# Patient Record
Sex: Female | Born: 1950 | Race: White | Hispanic: No | Marital: Married | Smoking: Former smoker
Health system: Southern US, Community
[De-identification: ages and names within clinical notes are randomized; demographics above are authoritative.]

## PROBLEM LIST (undated history)

## (undated) DIAGNOSIS — Z8669 Personal history of other diseases of the nervous system and sense organs: Secondary | ICD-10-CM

## (undated) DIAGNOSIS — E079 Disorder of thyroid, unspecified: Secondary | ICD-10-CM

## (undated) DIAGNOSIS — M5090 Cervical disc disorder, unspecified, unspecified cervical region: Secondary | ICD-10-CM

## (undated) DIAGNOSIS — M8580 Other specified disorders of bone density and structure, unspecified site: Secondary | ICD-10-CM

## (undated) DIAGNOSIS — N6019 Diffuse cystic mastopathy of unspecified breast: Secondary | ICD-10-CM

## (undated) DIAGNOSIS — M810 Age-related osteoporosis without current pathological fracture: Secondary | ICD-10-CM

## (undated) DIAGNOSIS — G4700 Insomnia, unspecified: Secondary | ICD-10-CM

## (undated) HISTORY — PX: OTHER SURGICAL HISTORY: SHX169

## (undated) HISTORY — PX: UPPER GASTROINTESTINAL ENDOSCOPY: SHX188

## (undated) HISTORY — DX: Age-related osteoporosis without current pathological fracture: M81.0

## (undated) HISTORY — DX: Insomnia, unspecified: G47.00

## (undated) HISTORY — DX: Disorder of thyroid, unspecified: E07.9

## (undated) HISTORY — DX: Diffuse cystic mastopathy of unspecified breast: N60.19

## (undated) HISTORY — DX: Personal history of other diseases of the nervous system and sense organs: Z86.69

## (undated) HISTORY — PX: COLONOSCOPY: SHX174

## (undated) HISTORY — PX: POLYPECTOMY: SHX149

## (undated) HISTORY — DX: Cervical disc disorder, unspecified, unspecified cervical region: M50.90

## (undated) HISTORY — DX: Other specified disorders of bone density and structure, unspecified site: M85.80

---

## 2000-08-11 ENCOUNTER — Other Ambulatory Visit: Admission: RE | Admit: 2000-08-11 | Discharge: 2000-08-11 | Payer: Self-pay | Admitting: Obstetrics and Gynecology

## 2001-06-29 ENCOUNTER — Other Ambulatory Visit: Admission: RE | Admit: 2001-06-29 | Discharge: 2001-06-29 | Payer: Self-pay | Admitting: Obstetrics and Gynecology

## 2002-09-27 ENCOUNTER — Encounter: Payer: Self-pay | Admitting: Internal Medicine

## 2002-09-27 ENCOUNTER — Encounter: Admission: RE | Admit: 2002-09-27 | Discharge: 2002-09-27 | Payer: Self-pay | Admitting: Internal Medicine

## 2002-11-04 ENCOUNTER — Other Ambulatory Visit: Admission: RE | Admit: 2002-11-04 | Discharge: 2002-11-04 | Payer: Self-pay | Admitting: Internal Medicine

## 2003-02-10 ENCOUNTER — Encounter: Payer: Self-pay | Admitting: Internal Medicine

## 2003-02-10 ENCOUNTER — Encounter: Admission: RE | Admit: 2003-02-10 | Discharge: 2003-02-10 | Payer: Self-pay | Admitting: Internal Medicine

## 2003-12-25 ENCOUNTER — Other Ambulatory Visit: Admission: RE | Admit: 2003-12-25 | Discharge: 2003-12-25 | Payer: Self-pay | Admitting: Internal Medicine

## 2003-12-29 ENCOUNTER — Encounter: Admission: RE | Admit: 2003-12-29 | Discharge: 2003-12-29 | Payer: Self-pay | Admitting: Internal Medicine

## 2005-04-15 ENCOUNTER — Other Ambulatory Visit: Admission: RE | Admit: 2005-04-15 | Discharge: 2005-04-15 | Payer: Self-pay | Admitting: Internal Medicine

## 2005-05-26 ENCOUNTER — Ambulatory Visit: Payer: Self-pay | Admitting: Internal Medicine

## 2005-06-01 ENCOUNTER — Ambulatory Visit: Payer: Self-pay | Admitting: Internal Medicine

## 2005-06-01 ENCOUNTER — Encounter (INDEPENDENT_AMBULATORY_CARE_PROVIDER_SITE_OTHER): Payer: Self-pay | Admitting: *Deleted

## 2006-05-04 ENCOUNTER — Other Ambulatory Visit: Admission: RE | Admit: 2006-05-04 | Discharge: 2006-05-04 | Payer: Self-pay | Admitting: Internal Medicine

## 2007-06-05 ENCOUNTER — Other Ambulatory Visit: Admission: RE | Admit: 2007-06-05 | Discharge: 2007-06-05 | Payer: Self-pay | Admitting: Internal Medicine

## 2007-09-13 ENCOUNTER — Encounter: Admission: RE | Admit: 2007-09-13 | Discharge: 2007-09-13 | Payer: Self-pay | Admitting: Internal Medicine

## 2008-09-08 ENCOUNTER — Ambulatory Visit: Payer: Self-pay | Admitting: Internal Medicine

## 2008-09-08 ENCOUNTER — Other Ambulatory Visit: Admission: RE | Admit: 2008-09-08 | Discharge: 2008-09-08 | Payer: Self-pay | Admitting: Internal Medicine

## 2009-03-30 ENCOUNTER — Ambulatory Visit: Payer: Self-pay | Admitting: Internal Medicine

## 2009-09-09 ENCOUNTER — Encounter: Admission: RE | Admit: 2009-09-09 | Discharge: 2009-09-09 | Payer: Self-pay | Admitting: Internal Medicine

## 2009-09-29 ENCOUNTER — Ambulatory Visit: Payer: Self-pay | Admitting: Internal Medicine

## 2009-09-29 ENCOUNTER — Other Ambulatory Visit: Admission: RE | Admit: 2009-09-29 | Discharge: 2009-09-29 | Payer: Self-pay | Admitting: Internal Medicine

## 2009-11-24 ENCOUNTER — Encounter (INDEPENDENT_AMBULATORY_CARE_PROVIDER_SITE_OTHER): Payer: Self-pay | Admitting: *Deleted

## 2010-01-26 ENCOUNTER — Encounter (INDEPENDENT_AMBULATORY_CARE_PROVIDER_SITE_OTHER): Payer: Self-pay | Admitting: *Deleted

## 2010-01-27 ENCOUNTER — Ambulatory Visit: Payer: Self-pay | Admitting: Internal Medicine

## 2010-02-10 ENCOUNTER — Ambulatory Visit: Payer: Self-pay | Admitting: Internal Medicine

## 2010-02-11 ENCOUNTER — Encounter: Payer: Self-pay | Admitting: Internal Medicine

## 2010-04-01 ENCOUNTER — Ambulatory Visit: Payer: Self-pay | Admitting: Internal Medicine

## 2010-10-07 ENCOUNTER — Ambulatory Visit: Payer: Self-pay | Admitting: Internal Medicine

## 2010-10-07 ENCOUNTER — Other Ambulatory Visit: Admission: RE | Admit: 2010-10-07 | Discharge: 2010-10-07 | Payer: Self-pay | Admitting: Internal Medicine

## 2010-10-19 ENCOUNTER — Encounter: Admission: RE | Admit: 2010-10-19 | Discharge: 2010-10-19 | Payer: Self-pay | Admitting: Internal Medicine

## 2010-11-04 ENCOUNTER — Ambulatory Visit: Payer: Self-pay | Admitting: Internal Medicine

## 2010-12-21 NOTE — Letter (Signed)
Summary: Colonoscopy Letter  [facility]  [address] [place], [place] [address]   Phone: [phone]  Fax: [phone]      November 24, 2009 MRN: [id number]   Maria Perkins [address] [place], [place]  [address]   Dear Ms. Gatz,   According to your medical record, it is time for you to schedule a Colonoscopy. The American Cancer Society recommends this procedure as a method to detect early colon cancer. Patients with a family history of colon cancer, or a personal history of colon polyps or inflammatory bowel disease are at increased risk.  This letter has beeen generated based on the recommendations made at the time of your procedure. If you feel that in your particular situation this may no longer apply, please contact our office.  Please call our office at [phone] to schedule this appointment or to update your records at your earliest convenience.  Thank you for cooperating with [place] to provide you with the very best care possible.   Sincerely,  Hedwig Morton. Juanda Chance, M.D.  [facility] Gastroenterology Division [phone]

## 2010-12-21 NOTE — Procedures (Signed)
Summary: Colonoscopy  Patient: [name] Note: All result statuses are Final unless otherwise noted.  Tests: (1) Colonoscopy (COL)   COL Colonoscopy           DONE     [facility]     [address]     [place], [place]  [address]           COLONOSCOPY PROCEDURE REPORT           PATIENT:  Maria Perkins, Maria Perkins  MR#:  [id number]     BIRTHDATE:  [birth year], 59 yrs. old  GENDER:  female           ENDOSCOPIST:  Hedwig Morton. Juanda Chance, MD     Referred by:  Sharlet Salina, M.D.           PROCEDURE DATE:  02/10/2010     PROCEDURE:  Colonoscopy 19[address]     ASA CLASS:  Class I     INDICATIONS:  Routine Risk Screening, family Hx of polyps last     colon2001     colon polyps in mothe and brother           MEDICATIONS:   Versed 8 mg, Fentanyl 75 mcg           DESCRIPTION OF PROCEDURE:   After the risks benefits and     alternatives of the procedure were thoroughly explained, informed     consent was obtained.  Digital rectal exam was performed and     revealed no rectal masses.   The LB [id number]-[id number] endoscope     was introduced through the anus and advanced to the cecum, which     was identified by both the appendix and ileocecal valve, without     limitations.  The quality of the prep was good, using MiraLax.     The instrument was then slowly withdrawn as the colon was fully     examined.     <<PROCEDUREIMAGES>>           FINDINGS:  A sessile polyp was found. 5 mm flat polyp right colon     Polyp was snared without cautery. Retrieval was successful (see     image4). snare polyp  This was otherwise a normal examination of     the colon (see image5, image3, image2, and image1).  Internal     hemorrhoids were found (see image5).   Retroflexed views in the     rectum revealed no abnormalities.    The scope was then withdrawn     from the patient and the procedure completed.           COMPLICATIONS:  None           ENDOSCOPIC IMPRESSION:     1) Sessile polyp     2) Otherwise  normal examination     3) Internal hemorrhoids     RECOMMENDATIONS:     1) Await pathology results           REPEAT EXAM:  In 5 - 7 year(s) for.           ______________________________     Hedwig Morton. Juanda Chance, MD           CC:           n.     eSIGNED:   Hedwig Morton. Delonda Coley at 02/10/2010 10:08 AM           Malon Kindle, [id number]  Note: An exclamation mark Marland Kitchen)  indicates a result that was not dispersed into the flowsheet. Document Creation Date: 02/10/2010 10:09 AM _______________________________________________________________________  (1) Order result status: Final Collection or observation date-time: 02/10/2010 10:00 Requested date-time:  Receipt date-time:  Reported date-time:  Referring Physician:   Ordering Physician: Lina Sar [phone]) Specimen Source:  Source: Launa Grill Order Number: [id number] Lab site:   Appended Document: Colonoscopy     Procedures Next Due Date:    Colonoscopy: 02/2015

## 2010-12-21 NOTE — Letter (Signed)
Summary: [facility] Instructions  [facility]  [address] [place], [place] [address]   Phone: [phone]  Fax: [phone]       DORI DEVINO    61-28-1952    MRN: [id number]       Procedure Day Dorna Bloom:  Wednesday  02/10/10     Arrival Time:  8:30am     Procedure Time:  9:30am     Location of Procedure:                    _ X_  [facility] (4th Floor) _   PREPARATION FOR COLONOSCOPY WITH MIRALAX  Starting 5 days prior to your procedure  Friday 03/18 do not eat nuts, seeds, popcorn, corn, beans, peas,  salads, or any raw vegetables.  Do not take any fiber supplements (e.g. Metamucil, Citrucel, and Benefiber). ____________________________________________________________________________________________________   THE DAY BEFORE YOUR PROCEDURE         DATE:  03/22  DAY:  Tuesday  1   Drink clear liquids the entire day-NO SOLID FOOD  2   Do not drink anything colored red or purple.  Avoid juices with pulp.  No orange juice.  3   Drink at least 64 oz. (8 glasses) of fluid/clear liquids during the day to prevent dehydration and help the prep work efficiently.  CLEAR LIQUIDS INCLUDE: Water Jello Ice Popsicles Tea (sugar ok, no milk/cream) Powdered fruit flavored drinks Coffee (sugar ok, no milk/cream) Gatorade Juice: apple, white grape, white cranberry  Lemonade Clear bullion, consomm, broth Carbonated beverages (any kind) Strained chicken noodle soup Hard Candy  4   Mix the entire bottle of Miralax with 64 oz. of Gatorade/Powerade in the morning and put in the refrigerator to chill.  5   At 3:00 pm take 2 Dulcolax/Bisacodyl tablets.  6   At 4:30 pm take one Reglan/Metoclopramide tablet.  7  Starting at 5:00 pm drink one 8 oz glass of the Miralax mixture every 15-20 minutes until you have finished drinking the entire 64 oz.  You should finish drinking prep around 7:30 or 8:00 pm.  8   If you are nauseated, you may take the 2nd  Reglan/Metoclopramide tablet at 6:30 pm.        9    At 8:00 pm take 2 more DULCOLAX/Bisacodyl tablets.     THE DAY OF YOUR PROCEDURE      DATE:   03/23  DAY: Wednesday  You may drink clear liquids until  7:30am   (2 HOURS BEFORE PROCEDURE).   MEDICATION INSTRUCTIONS  Unless otherwise instructed, you should take regular prescription medications with a small sip of water as early as possible the morning of your procedure.           OTHER INSTRUCTIONS  You will need a responsible adult at least 61 years of age to accompany you and drive you home.   This person must remain in the waiting room during your procedure.  Wear loose fitting clothing that is easily removed.  Leave jewelry and other valuables at home.  However, you may wish to bring a book to read or an iPod/MP3 player to listen to music as you wait for your procedure to start.  Remove all body piercing jewelry and leave at home.  Total time from sign-in until discharge is approximately 2-3 hours.  You should go home directly after your procedure and rest.  You can resume normal activities the day after your procedure.  The day  of your procedure you should not:   Drive   Make legal decisions   Operate machinery   Drink alcohol   Return to work  You will receive specific instructions about eating, activities and medications before you leave.   The above instructions have been reviewed and explained to me by   Ezra Sites RN  January 27, 2010 11:25 AM    I fully understand and can verbalize these instructions _____________________________ Date _______

## 2010-12-21 NOTE — Miscellaneous (Signed)
Summary: [facility]  Clinical Lists Changes  Medications: Added new medication of MIRALAX   POWD (POLYETHYLENE GLYCOL 3350) As per prep  instructions. - Signed Added new medication of REGLAN 10 MG  TABS (METOCLOPRAMIDE HCL) As per prep instructions. - Signed Added new medication of DULCOLAX 5 MG  TBEC (BISACODYL) Day before procedure take 2 at 3pm and 2 at 8pm. - Signed Rx of MIRALAX   POWD (POLYETHYLENE GLYCOL 3350) As per prep  instructions.;  #255gm x 0;  Signed;  Entered by: Ezra Sites RN;  Authorized by: Hart Carwin MD;  Method used: Electronically to [facility]*, [address] [place], [place]  [id number], Ph: [phone], Fax: [phone] Rx of REGLAN 10 MG  TABS (METOCLOPRAMIDE HCL) As per prep instructions.;  #2 x 0;  Signed;  Entered by: Ezra Sites RN;  Authorized by: Hart Carwin MD;  Method used: Electronically to [facility]*, [address] [place], [place]  [address], Ph: [phone], Fax: [phone] Rx of DULCOLAX 5 MG  TBEC (BISACODYL) Day before procedure take 2 at 3pm and 2 at 8pm.;  #4 x 0;  Signed;  Entered by: Ezra Sites RN;  Authorized by: Hart Carwin MD;  Method used: Electronically to [facility]*, [address] [place], [place]  [address], Ph: [phone], Fax: [phone] Observations: Added new observation of NKA: T (01/27/2010 10:59)    Prescriptions: DULCOLAX 5 MG  TBEC (BISACODYL) Day before procedure take 2 at 3pm and 2 at 8pm.  #4 x 0   Entered by:   Ezra Sites RN   Authorized by:   Hart Carwin MD   Signed by:   Ezra Sites RN on 01/27/2010   Method used:   Electronically to        [facility]* (retail)       [address]       [place], [place]  [address]       Ph: [phone]       Fax: [phone]   RxID:   [id number] REGLAN 10 MG  TABS (METOCLOPRAMIDE HCL) As per prep instructions.  #2 x 0   Entered by:   Ezra Sites RN   Authorized by:   Hart Carwin MD   Signed by:    Ezra Sites RN on 01/27/2010   Method used:   Electronically to        [facility]* (retail)       [address]       [place], [place]  [address]       Ph: [phone]       Fax: [phone]   RxID:   [id number] MIRALAX   POWD (POLYETHYLENE GLYCOL 3350) As per prep  instructions.  #255gm x 0   Entered by:   Ezra Sites RN   Authorized by:   Hart Carwin MD   Signed by:   Ezra Sites RN on 01/27/2010   Method used:   Electronically to        [facility]* (retail)       [address]       [place], [place]  [address]       Ph: [phone]       Fax: [phone]   RxID:   [id number]

## 2010-12-21 NOTE — Letter (Signed)
Summary: Patient Notice- Polyp Results  [facility]  [address] [place], [place] [address]   Phone: [phone]  Fax: [phone]        February 11, 2010 MRN: [id number]    ALLESSANDRA BERNARDI [address] [place], [place]  [address]    Dear Ms. Magill,  I am pleased to inform you that the colon polyp(s) removed during your recent colonoscopy was (were) found to be benign (no cancer detected) upon pathologic examination.Your polyp was adenomatous ( precancerous)  I recommend you have a repeat colonoscopy examination in 5_ years to look for recurrent polyps, as having colon polyps increases your risk for having recurrent polyps or even colon cancer in the future.  Should you develop new or worsening symptoms of abdominal pain, bowel habit changes or bleeding from the rectum or bowels, please schedule an evaluation with either your primary care physician or with me.  Additional information/recommendations:  _x_ No further action with gastroenterology is needed at this time. Please      follow-up with your primary care physician for your other healthcare      needs.  __ Please call [phone] to schedule a return visit to review your      situation.  __ Please keep your follow-up visit as already scheduled.  __ Continue treatment plan as outlined the day of your exam.  Please call us if you are having persistent problems or have questions about your condition that have not been fully answered at this time.  Sincerely,  Hart Carwin MD  This letter has been electronically signed by your physician.  Appended Document: Patient Notice- Polyp Results Letter mailed 3.25.11

## 2011-01-11 DIAGNOSIS — G43909 Migraine, unspecified, not intractable, without status migrainosus: Secondary | ICD-10-CM

## 2011-01-11 DIAGNOSIS — E039 Hypothyroidism, unspecified: Secondary | ICD-10-CM

## 2011-01-13 DIAGNOSIS — G43909 Migraine, unspecified, not intractable, without status migrainosus: Secondary | ICD-10-CM

## 2011-04-07 ENCOUNTER — Ambulatory Visit (INDEPENDENT_AMBULATORY_CARE_PROVIDER_SITE_OTHER): Payer: BC Managed Care – PPO | Admitting: Internal Medicine

## 2011-04-07 ENCOUNTER — Encounter: Payer: Self-pay | Admitting: Internal Medicine

## 2011-04-07 DIAGNOSIS — E039 Hypothyroidism, unspecified: Secondary | ICD-10-CM

## 2011-04-07 DIAGNOSIS — G4700 Insomnia, unspecified: Secondary | ICD-10-CM

## 2011-04-07 MED ORDER — ZOLPIDEM TARTRATE 10 MG PO TABS: 10.0000 mg | ORAL_TABLET | Freq: Every evening | ORAL | Status: DC | PRN

## 2011-04-07 MED ORDER — LEVOTHYROXINE SODIUM 125 MCG PO TABS: 125.0000 ug | ORAL_TABLET | Freq: Every day | ORAL | Status: DC

## 2011-04-07 NOTE — Patient Instructions (Signed)
Call if headache persists

## 2011-04-07 NOTE — Progress Notes (Signed)
  Subjective:    Patient ID: Maria Perkins, female    DOB: [birth year], 60 y.o.   MRN: [id number]  HPI For 6 month follow up of hypothyroidism. Has slight left parietal headache. No focal deficits reported. Unrelieved with Excedrin migraine x 1.  Some stress with selling house and sick dog.    Review of Systems  Neurological: Positive for headaches. Negative for dizziness, facial asymmetry and light-headedness.       Objective:   Physical Exam  Constitutional: She is oriented to person, place, and time.  Neck: No thyromegaly present.  Neurological: She is alert and oriented to person, place, and time. She has normal reflexes. No cranial nerve deficit. She exhibits normal muscle tone. Coordination normal.       EOM's full PERLA. No nystagmus. Fundi are benign.          Assessment & Plan:  1- Hypothyroidism- stable continue same dose of thyroid replacement 2-Neuralgia left parietal area likely stress related

## 2011-04-07 NOTE — Progress Notes (Signed)
Addended by: Odette Horns on: 04/07/2011 11:59 AM   Modules accepted: Orders

## 2011-04-11 ENCOUNTER — Encounter: Payer: Self-pay | Admitting: Internal Medicine

## 2011-04-12 ENCOUNTER — Encounter: Payer: Self-pay | Admitting: Internal Medicine

## 2011-10-03 ENCOUNTER — Telehealth: Payer: Self-pay | Admitting: Internal Medicine

## 2011-10-03 DIAGNOSIS — E039 Hypothyroidism, unspecified: Secondary | ICD-10-CM

## 2011-10-03 MED ORDER — LEVOTHYROXINE SODIUM 125 MCG PO TABS: 125.0000 ug | ORAL_TABLET | Freq: Every day | ORAL | Status: DC

## 2011-10-03 NOTE — Telephone Encounter (Signed)
rx refilled fpr patient for 1 month only, as she is due for lab  Work.

## 2011-10-03 NOTE — Telephone Encounter (Signed)
Call in 30 day supply Synthroid 0.125mg  to [facility]

## 2011-10-04 ENCOUNTER — Other Ambulatory Visit: Payer: Self-pay | Admitting: Internal Medicine

## 2011-10-17 ENCOUNTER — Ambulatory Visit (INDEPENDENT_AMBULATORY_CARE_PROVIDER_SITE_OTHER): Payer: BC Managed Care – PPO | Admitting: Internal Medicine

## 2011-10-17 ENCOUNTER — Encounter: Payer: Self-pay | Admitting: Internal Medicine

## 2011-10-17 VITALS — BP 106/64 | HR 64 | Temp 97.8°F | Ht 64.0 in | Wt 134.5 lb

## 2011-10-17 DIAGNOSIS — Z23 Encounter for immunization: Secondary | ICD-10-CM

## 2011-10-17 DIAGNOSIS — E039 Hypothyroidism, unspecified: Secondary | ICD-10-CM | POA: Insufficient documentation

## 2011-10-17 DIAGNOSIS — Z0000 Encounter for general adult medical examination without abnormal findings: Secondary | ICD-10-CM

## 2011-10-17 LAB — CBC WITH DIFFERENTIAL/PLATELET
Eosinophils Relative: 3 % (ref 0–5)
Lymphocytes Relative: 43 % (ref 12–46)
MCH: 30.6 pg (ref 26.0–34.0)
Monocytes Absolute: 0.5 10*3/uL (ref 0.1–1.0)
Neutro Abs: 2.1 10*3/uL (ref 1.7–7.7)
Neutrophils Relative %: 44 % (ref 43–77)
RBC: 4.48 MIL/uL (ref 3.87–5.11)
RDW: 13 % (ref 11.5–15.5)
WBC: 4.9 10*3/uL (ref 4.0–10.5)

## 2011-10-17 LAB — COMPREHENSIVE METABOLIC PANEL
ALT: 14 U/L (ref 0–35)
CO2: 29 mEq/L (ref 19–32)
Calcium: 10.1 mg/dL (ref 8.4–10.5)
Creat: 0.73 mg/dL (ref 0.50–1.10)
Potassium: 4.4 mEq/L (ref 3.5–5.3)
Sodium: 140 mEq/L (ref 135–145)
Total Bilirubin: 1.5 mg/dL — ABNORMAL HIGH (ref 0.3–1.2)

## 2011-10-17 LAB — POCT URINALYSIS DIPSTICK
Bilirubin, UA: NEGATIVE
Leukocytes, UA: NEGATIVE
Spec Grav, UA: 1.01
pH, UA: 6.5

## 2011-10-17 LAB — LIPID PANEL
HDL: 73 mg/dL (ref >39)
LDL Cholesterol: 90 mg/dL (ref 0–99)

## 2011-10-17 NOTE — Progress Notes (Signed)
  Subjective:    Patient ID: Maria Perkins, female    DOB: 1951/05/24, 60 y.o.   MRN: [id number]  HPI  60 year old white female with history of hypothyroidism in today for health maintenance exam. 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 Never required surgery. She has a history of osteopenia and took Fosamax 70 mg weekly but this was discontinued in 2006 after being on it for a number of years. Had colonoscopy in 2001. Was get influenza immunization today. Had tetanus immunization in 2005.  Family history: 2 brothers in good health no sisters. Mother died at age 7 from complications of a stroke that she had in 1999-11-09. Father died with history of diabetes and dementia.  Social history - is married, has 2 adult daughters one living in [place] in the a one living in [place].    Review of Systems  Constitutional: Negative.   HENT: Negative.   Eyes: Negative.   Cardiovascular: Negative.   Gastrointestinal: Negative.   Genitourinary: Negative.   Musculoskeletal: Negative.   Neurological: Negative.   Psychiatric/Behavioral: Negative.        Objective:   Physical Exam  Vitals reviewed. Constitutional: She is oriented to person, place, and time. She appears well-developed and well-nourished.  HENT:  Head: Normocephalic and atraumatic.  Right Ear: External ear normal.  Left Ear: External ear normal.  Mouth/Throat: Oropharynx is clear and moist.  Eyes: Conjunctivae and EOM are normal. Pupils are equal, round, and reactive to light. No scleral icterus.  Neck: Neck supple. No JVD present. No thyromegaly present.  Cardiovascular: Normal rate, regular rhythm, normal heart sounds and intact distal pulses.   No murmur heard. Pulmonary/Chest: Effort normal and breath sounds normal. She has no wheezes. She has no rales.  Abdominal: Soft. Bowel sounds are normal. She exhibits no distension. There  is no rebound and no guarding.  Genitourinary:       Deferred  Musculoskeletal: Normal range of motion. She exhibits no edema.  Lymphadenopathy:    She has no cervical adenopathy.  Neurological: She is alert and oriented to person, place, and time. She has normal reflexes. No cranial nerve deficit. Coordination normal.  Skin: Skin is warm and dry. She is not diaphoretic. No erythema. No pallor.  Psychiatric: She has a normal mood and affect. Judgment and thought content normal.          Assessment & Plan:  Hypothyroidism on thyroid replacement therapy  Plan: Return in 6 months for TSH and office visit. Influenza immunization given today.

## 2011-10-17 NOTE — Patient Instructions (Signed)
Continue with Synthroid. Fasting labs are drawn and are pending. They will be forwarded to you by mail. Continue same dose of Synthroid assuming TSH is normal and recheck in 6 months

## 2011-10-18 LAB — VITAMIN D 25 HYDROXY (VIT D DEFICIENCY, FRACTURES): Vit D, 25-Hydroxy: 51 ng/mL (ref 30–89)

## 2011-11-01 ENCOUNTER — Other Ambulatory Visit: Payer: Self-pay | Admitting: Internal Medicine

## 2011-11-01 DIAGNOSIS — Z1231 Encounter for screening mammogram for malignant neoplasm of breast: Secondary | ICD-10-CM

## 2011-11-24 ENCOUNTER — Ambulatory Visit: Payer: BC Managed Care – PPO

## 2011-11-30 ENCOUNTER — Other Ambulatory Visit: Payer: Self-pay

## 2011-11-30 MED ORDER — LEVOTHYROXINE SODIUM 125 MCG PO TABS: 125.0000 ug | ORAL_TABLET | Freq: Every day | ORAL | Status: DC

## 2011-12-06 ENCOUNTER — Ambulatory Visit
Admission: RE | Admit: 2011-12-06 | Discharge: 2011-12-06 | Disposition: A | Discharge status: Home / Self Care | Payer: BC Managed Care – PPO | Source: Ambulatory Visit | Attending: Internal Medicine | Admitting: Internal Medicine

## 2011-12-06 DIAGNOSIS — Z1231 Encounter for screening mammogram for malignant neoplasm of breast: Secondary | ICD-10-CM

## 2012-04-19 ENCOUNTER — Encounter: Payer: Self-pay | Admitting: Internal Medicine

## 2012-04-19 ENCOUNTER — Ambulatory Visit (INDEPENDENT_AMBULATORY_CARE_PROVIDER_SITE_OTHER): Payer: BC Managed Care – PPO | Admitting: Internal Medicine

## 2012-04-19 VITALS — BP 96/66 | HR 64 | Temp 98.5°F | Wt 129.5 lb

## 2012-04-19 DIAGNOSIS — E039 Hypothyroidism, unspecified: Secondary | ICD-10-CM

## 2012-04-19 DIAGNOSIS — N9489 Other specified conditions associated with female genital organs and menstrual cycle: Secondary | ICD-10-CM

## 2012-04-19 DIAGNOSIS — N898 Other specified noninflammatory disorders of vagina: Secondary | ICD-10-CM | POA: Insufficient documentation

## 2012-04-19 DIAGNOSIS — G4700 Insomnia, unspecified: Secondary | ICD-10-CM

## 2012-04-19 LAB — TSH: TSH: 0.532 u[IU]/mL (ref 0.350–4.500)

## 2012-04-19 NOTE — Progress Notes (Signed)
  Subjective:    Patient ID: Maria Perkins, female    DOB: 1951/09/28, 61 y.o.   MRN: [id number]  HPI 61 year old white female in today for six-month recheck. Long-standing history of hypothyroidism currently on Synthroid 0.125 mg daily. History of vaginal dryness related to menopause. Used to have Estring in place. Wants to try Premarin vaginal cream once again. Also has intermittent insomnia. Needs refill on Ambien. No other complaints or problems.    Review of Systems     Objective:   Physical Exam neck is supple without thyromegaly; chest clear to auscultation; cardiac exam regular rate and rhythm; extremities without edema        Assessment & Plan:  Hypothyroidism  Vaginal dryness  Insomnia  Plan: Refill Premarin vaginal cream when necessary one year to use 3 times weekly; refill Ambien 10 mg #30 one half to one by mouth each bedtime when necessary sleep with no refill; Synthroid 0.125 mg #30 with 5 refills 1 by mouth daily. Physical exam scheduled early December 2013.

## 2012-04-19 NOTE — Patient Instructions (Addendum)
Continue Synthroid 0.125 mg daily; Premarin vaginal cream in vagina 3 times weekly; use Ambien sparingly at bedtime as directed

## 2012-10-16 ENCOUNTER — Other Ambulatory Visit: Payer: BC Managed Care – PPO | Admitting: Internal Medicine

## 2012-10-17 ENCOUNTER — Other Ambulatory Visit (INDEPENDENT_AMBULATORY_CARE_PROVIDER_SITE_OTHER): Payer: BC Managed Care – PPO | Admitting: Internal Medicine

## 2012-10-17 DIAGNOSIS — E039 Hypothyroidism, unspecified: Secondary | ICD-10-CM

## 2012-10-17 DIAGNOSIS — Z0000 Encounter for general adult medical examination without abnormal findings: Secondary | ICD-10-CM

## 2012-10-18 LAB — CBC WITH DIFFERENTIAL/PLATELET
Eosinophils Relative: 2 % (ref 0–5)
HCT: 41.2 % (ref 36.0–46.0)
Hemoglobin: 13.8 g/dL (ref 12.0–15.0)
Lymphocytes Relative: 36 % (ref 12–46)
MCH: 30.7 pg (ref 26.0–34.0)
MCV: 91.6 fL (ref 78.0–100.0)
Monocytes Relative: 9 % (ref 3–12)
Neutrophils Relative %: 52 % (ref 43–77)
RBC: 4.5 MIL/uL (ref 3.87–5.11)
RDW: 14 % (ref 11.5–15.5)

## 2012-10-18 LAB — LIPID PANEL
Cholesterol: 165 mg/dL (ref 0–200)
HDL: 73 mg/dL (ref >39)
LDL Cholesterol: 77 mg/dL (ref 0–99)

## 2012-10-18 LAB — COMPREHENSIVE METABOLIC PANEL
Albumin: 4.6 g/dL (ref 3.5–5.2)
BUN: 18 mg/dL (ref 6–23)
Chloride: 104 mEq/L (ref 96–112)
Creat: 0.68 mg/dL (ref 0.50–1.10)
Glucose, Bld: 84 mg/dL (ref 70–99)
Potassium: 5.6 mEq/L — ABNORMAL HIGH (ref 3.5–5.3)
Sodium: 141 mEq/L (ref 135–145)

## 2012-10-18 LAB — TSH: TSH: 0.221 u[IU]/mL — ABNORMAL LOW (ref 0.350–4.500)

## 2012-10-22 ENCOUNTER — Ambulatory Visit (INDEPENDENT_AMBULATORY_CARE_PROVIDER_SITE_OTHER): Payer: PRIVATE HEALTH INSURANCE | Admitting: Internal Medicine

## 2012-10-22 ENCOUNTER — Encounter: Payer: Self-pay | Admitting: Internal Medicine

## 2012-10-22 VITALS — BP 110/76 | HR 72 | Temp 98.7°F | Ht 64.0 in | Wt 129.5 lb

## 2012-10-22 DIAGNOSIS — E875 Hyperkalemia: Secondary | ICD-10-CM

## 2012-10-22 DIAGNOSIS — Z23 Encounter for immunization: Secondary | ICD-10-CM

## 2012-10-22 DIAGNOSIS — Z0000 Encounter for general adult medical examination without abnormal findings: Secondary | ICD-10-CM

## 2012-10-22 LAB — POCT URINALYSIS DIPSTICK
Bilirubin, UA: NEGATIVE
Blood, UA: NEGATIVE
Glucose, UA: NEGATIVE
Ketones, UA: NEGATIVE
Leukocytes, UA: NEGATIVE
Nitrite, UA: NEGATIVE
Spec Grav, UA: <=1.005
pH, UA: 6.5

## 2012-10-22 MED ORDER — LEVOTHYROXINE SODIUM 125 MCG PO TABS: 125.0000 ug | ORAL_TABLET | Freq: Every day | ORAL | Status: DC

## 2012-10-27 NOTE — Progress Notes (Signed)
  Subjective:    Patient ID: Maria Perkins, female    DOB: December 19, 1950, 61 y.o.   MRN: [id number]  HPI 61 year old white female with history of hypothyroidism and insomnia in today for health maintenance exam and evaluation of medical issues.   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 She never required surgery. History of osteopenia and took Fosamax 70 mg weekly but this was discontinued in 2006 after being on it for a number of years. Had colonoscopy in 2001. Had tetanus immunization in 2005.  Social history: Married has 2 adult daughters. Husband is former Programmer, [employer] of [employer].  2 adult daughters.  Does not smoke. Social alcohol consumption.  Family history: 2 brothers in good health. No sisters. Mother died at age 27 from complications of a stroke that she had in 11-17-99. Father died with history of diabetes and dementia.    Review of Systems  Constitutional: Negative.   HENT: Negative.   Eyes: Negative.   Respiratory: Negative.   Cardiovascular: Negative.   Gastrointestinal: Negative.   Genitourinary: Negative.   Musculoskeletal: Negative.   Neurological: Negative.   Hematological: Negative.   Psychiatric/Behavioral:       Insomnia long-standing. Seldom takes  Ambien but does occasionally.        Objective:   Physical Exam  Vitals reviewed. Constitutional: She is oriented to person, place, and time. She appears well-developed and well-nourished. No distress.  HENT:  Head: Normocephalic and atraumatic.  Right Ear: External ear normal.  Left Ear: External ear normal.  Mouth/Throat: Oropharynx is clear and moist. No oropharyngeal exudate.  Eyes: Conjunctivae normal and EOM are normal. Pupils are equal, round, and reactive to light. Right eye exhibits no discharge. Left eye exhibits no discharge. No scleral icterus.  Neck: Normal range of motion. Neck supple. No JVD  present. No thyromegaly present.  Cardiovascular: Normal rate, regular rhythm, normal heart sounds and intact distal pulses.   No murmur heard. Pulmonary/Chest: Effort normal and breath sounds normal. No respiratory distress. She has no wheezes. She has no rales. She exhibits no tenderness.       Breasts normal female  Abdominal: Soft. Bowel sounds are normal. She exhibits no distension and no mass. There is no tenderness. There is no rebound and no guarding.  Genitourinary: Vagina normal.       Pap taken  Musculoskeletal: Normal range of motion. She exhibits no edema.  Lymphadenopathy:    She has no cervical adenopathy.  Neurological: She is alert and oriented to person, place, and time. She has normal reflexes. She displays normal reflexes. No cranial nerve deficit. Coordination normal.  Skin: Skin is warm and dry. No rash noted. She is not diaphoretic. No erythema. No pallor.  Psychiatric: She has a normal mood and affect. Her behavior is normal. Judgment and thought content normal.          Assessment & Plan:  Hypothyroidism  Insomnia  Plan: Refill Ambien to take sparingly as needed for insomnia which is occasionally. Continue same dose of Synthroid. Return in 6 months for office visit and TSH. Addendum: Recent fasting lab work shows an elevated serum potassium which is likely an error. This will be repeated. Addendum: Repeat potassium is within normal limits.

## 2012-10-27 NOTE — Patient Instructions (Addendum)
Your serum potassium will be repeated. It is likely an error. Return in 6 months. Continue same medications.

## 2013-04-30 ENCOUNTER — Ambulatory Visit: Payer: PRIVATE HEALTH INSURANCE | Admitting: Internal Medicine

## 2013-05-16 ENCOUNTER — Ambulatory Visit (INDEPENDENT_AMBULATORY_CARE_PROVIDER_SITE_OTHER): Payer: PRIVATE HEALTH INSURANCE | Admitting: Internal Medicine

## 2013-05-16 ENCOUNTER — Encounter: Payer: Self-pay | Admitting: Internal Medicine

## 2013-05-16 VITALS — BP 106/78 | HR 64 | Wt 129.0 lb

## 2013-05-16 DIAGNOSIS — E039 Hypothyroidism, unspecified: Secondary | ICD-10-CM

## 2013-05-16 DIAGNOSIS — M549 Dorsalgia, unspecified: Secondary | ICD-10-CM | POA: Insufficient documentation

## 2013-05-16 NOTE — Progress Notes (Signed)
  Subjective:    Patient ID: Maria Perkins, female    DOB: [birth year], 62 y.o.   MRN: [id number]  HPI Patient in today for six-month recheck of hypothyroidism. TSH drawn on thyroid replacement therapy consisting of Synthroid 0.125 mg daily. No complaints with hypothyroidism. She was making some very heavy curtains for her home about 3 months ago and spent 3 weeks or so on her knees cutting in matching fabric. After that began to experience back pain initially in her left lower back and nail intermittently in her right lower back. She reached for something recently at [facility] and felt extreme pain in her right lower back over her posterior superior iliac spine. No radiation into her legs with this pain. No weakness in her legs. Sometimes pain is in right buttock. Pain tends to be sharp and intermittent.    Review of Systems     Objective:   Physical Exam straight leg raising is negative at 90 bilaterally. Muscle strength is 5 over 5 in the lower extremities. Deep tendon reflexes 2+ and symmetrical in the knees 1+ and symmetrical in the ankles. Neck is supple without thyromegaly.        Assessment & Plan:  Low back pain-suspect it is mechanical rather than radicular pain and related to back strain from making curtains several months ago.  Hypothyroidism  Estrogen replacement-per GYN  Plan: TSH drawn and is pending. Usually stays on same dose of thyroid replacement. TSH will be reviewed and recommendations made. Return in 6 months for physical exam. For low back pain try Celebrex 200 mg daily for 2-3 weeks. If no improvement consider physical therapy. She is considering taking some beginning of the classes which might be good for her back.  Time spent with patient 25 minutes discussing these issues at length.

## 2013-05-16 NOTE — Addendum Note (Signed)
Addended by: Judy Pimple on: 05/16/2013 12:09 PM   Modules accepted: Orders

## 2013-05-16 NOTE — Patient Instructions (Addendum)
Continue same dose of Synthroid and return in 6 months. Try Celebrex for back pain.

## 2013-05-20 ENCOUNTER — Other Ambulatory Visit: Payer: Self-pay | Admitting: Internal Medicine

## 2013-08-09 ENCOUNTER — Telehealth: Payer: Self-pay | Admitting: Internal Medicine

## 2013-08-12 ENCOUNTER — Other Ambulatory Visit: Payer: Self-pay

## 2013-08-13 ENCOUNTER — Other Ambulatory Visit: Payer: Self-pay

## 2013-08-13 MED ORDER — LEVOTHYROXINE SODIUM 125 MCG PO TABS: 125.0000 ug | ORAL_TABLET | Freq: Every day | ORAL | Status: DC

## 2013-09-26 ENCOUNTER — Other Ambulatory Visit: Payer: Self-pay

## 2013-10-24 ENCOUNTER — Other Ambulatory Visit: Payer: PRIVATE HEALTH INSURANCE | Admitting: Internal Medicine

## 2013-10-24 DIAGNOSIS — E039 Hypothyroidism, unspecified: Secondary | ICD-10-CM

## 2013-10-24 DIAGNOSIS — Z130 Encounter for screening for diseases of the blood and blood-forming organs and certain disorders involving the immune mechanism: Secondary | ICD-10-CM

## 2013-10-24 DIAGNOSIS — Z13220 Encounter for screening for lipoid disorders: Secondary | ICD-10-CM

## 2013-10-24 DIAGNOSIS — Z0000 Encounter for general adult medical examination without abnormal findings: Secondary | ICD-10-CM

## 2013-10-24 LAB — CBC WITH DIFFERENTIAL/PLATELET
Eosinophils Absolute: 0.1 10*3/uL (ref 0.0–0.7)
Lymphocytes Relative: 40 % (ref 12–46)
MCV: 90.1 fL (ref 78.0–100.0)
RBC: 4.56 MIL/uL (ref 3.87–5.11)
RDW: 13.7 % (ref 11.5–15.5)
WBC: 4.8 10*3/uL (ref 4.0–10.5)

## 2013-10-24 LAB — LIPID PANEL
Cholesterol: 190 mg/dL (ref 0–200)
HDL: 73 mg/dL (ref >39)
Triglycerides: 71 mg/dL (ref <150)
VLDL: 14 mg/dL (ref 0–40)

## 2013-10-24 LAB — COMPREHENSIVE METABOLIC PANEL
ALT: 12 U/L (ref 0–35)
Albumin: 4.2 g/dL (ref 3.5–5.2)
Alkaline Phosphatase: 55 U/L (ref 39–117)
Calcium: 10 mg/dL (ref 8.4–10.5)
Chloride: 103 mEq/L (ref 96–112)
Potassium: 4.4 mEq/L (ref 3.5–5.3)
Sodium: 140 mEq/L (ref 135–145)
Total Bilirubin: 1.6 mg/dL — ABNORMAL HIGH (ref 0.3–1.2)

## 2013-10-25 ENCOUNTER — Encounter: Payer: Self-pay | Admitting: Internal Medicine

## 2013-10-25 ENCOUNTER — Other Ambulatory Visit (HOSPITAL_COMMUNITY)
Admission: RE | Admit: 2013-10-25 | Discharge: 2013-10-25 | Disposition: A | Discharge status: Home / Self Care | Payer: PRIVATE HEALTH INSURANCE | Source: Ambulatory Visit | Attending: Internal Medicine | Admitting: Internal Medicine

## 2013-10-25 ENCOUNTER — Ambulatory Visit (INDEPENDENT_AMBULATORY_CARE_PROVIDER_SITE_OTHER): Payer: PRIVATE HEALTH INSURANCE | Admitting: Internal Medicine

## 2013-10-25 VITALS — BP 90/60 | HR 68 | Temp 98.1°F | Ht 64.0 in | Wt 128.0 lb

## 2013-10-25 DIAGNOSIS — Z23 Encounter for immunization: Secondary | ICD-10-CM

## 2013-10-25 DIAGNOSIS — M5090 Cervical disc disorder, unspecified, unspecified cervical region: Secondary | ICD-10-CM

## 2013-10-25 DIAGNOSIS — Z01419 Encounter for gynecological examination (general) (routine) without abnormal findings: Secondary | ICD-10-CM | POA: Insufficient documentation

## 2013-10-25 DIAGNOSIS — E039 Hypothyroidism, unspecified: Secondary | ICD-10-CM

## 2013-10-25 DIAGNOSIS — Z0000 Encounter for general adult medical examination without abnormal findings: Secondary | ICD-10-CM

## 2013-10-25 LAB — POCT URINALYSIS DIPSTICK
Glucose, UA: NEGATIVE
Ketones, UA: NEGATIVE
Leukocytes, UA: NEGATIVE
Nitrite, UA: NEGATIVE
Protein, UA: NEGATIVE
Spec Grav, UA: 1.015
Urobilinogen, UA: 0.2

## 2013-10-25 LAB — VITAMIN D 25 HYDROXY (VIT D DEFICIENCY, FRACTURES): Vit D, 25-Hydroxy: 46 ng/mL (ref 30–89)

## 2013-10-25 MED ORDER — ESTRADIOL 0.1 MG/GM VA CREA: 1.0000 | TOPICAL_CREAM | VAGINAL | Status: DC

## 2013-10-25 NOTE — Patient Instructions (Addendum)
Return in 6-12 months for lipid panel, OV, and TSH

## 2014-01-09 ENCOUNTER — Other Ambulatory Visit: Payer: Self-pay | Admitting: Internal Medicine

## 2014-01-09 DIAGNOSIS — Z780 Asymptomatic menopausal state: Secondary | ICD-10-CM

## 2014-01-09 DIAGNOSIS — Z1231 Encounter for screening mammogram for malignant neoplasm of breast: Secondary | ICD-10-CM

## 2014-02-04 ENCOUNTER — Ambulatory Visit
Admission: RE | Admit: 2014-02-04 | Discharge: 2014-02-04 | Disposition: A | Discharge status: Home / Self Care | Payer: BC Managed Care – PPO | Source: Ambulatory Visit | Attending: Internal Medicine | Admitting: Internal Medicine

## 2014-02-04 DIAGNOSIS — Z1231 Encounter for screening mammogram for malignant neoplasm of breast: Secondary | ICD-10-CM

## 2014-02-04 DIAGNOSIS — Z780 Asymptomatic menopausal state: Secondary | ICD-10-CM

## 2014-02-05 NOTE — Progress Notes (Signed)
Left message to call.

## 2014-02-05 NOTE — Progress Notes (Signed)
Appointment made

## 2014-02-13 ENCOUNTER — Ambulatory Visit (INDEPENDENT_AMBULATORY_CARE_PROVIDER_SITE_OTHER): Payer: BC Managed Care – PPO | Admitting: Internal Medicine

## 2014-02-13 ENCOUNTER — Encounter: Payer: Self-pay | Admitting: Internal Medicine

## 2014-02-13 VITALS — BP 98/66 | Temp 98.6°F | Ht 64.0 in | Wt 128.0 lb

## 2014-02-13 DIAGNOSIS — R51 Headache: Secondary | ICD-10-CM

## 2014-02-13 DIAGNOSIS — R519 Headache, unspecified: Secondary | ICD-10-CM

## 2014-02-13 DIAGNOSIS — M810 Age-related osteoporosis without current pathological fracture: Secondary | ICD-10-CM

## 2014-02-13 NOTE — Patient Instructions (Addendum)
Appt with endocrinologist. Take Aleve twice daily for the next 7-10 days for occipital headache and ice neck down for 20 minutes daily. Call if headache persists.

## 2014-02-13 NOTE — Progress Notes (Signed)
   Subjective:    Patient ID: Maria Perkins, female    DOB: 04/06/1951, 63 y.o.   MRN: [id number]  HPI Patient in today at my request to discuss bone density study. She has a family history of osteoporosis. She has a history of osteopenia and took Actonel for 2 years beginning in 2004 for 2006 and then took Fosamax for 2 years. She is very physically active. She had recent bone density study showing  T score of -2.5 in left femoral neck and -2.2 in the LS-spine. The reason she discontinued bisphosphonates  was because of some GI discomfort. Now wondering if she would be a candidate for Prolia but she has lots of questions about Prolia and side effects.  Also she's had a right occipital headache for several weeks. It gets better at times. Seems to respond some to over-the-counter anti-inflammatory medication and Tylenol. She went to a massage therapist recently and that seemed to help a great deal. She has no visual disturbances or vomiting. Doesn't recall any activities that would've strained her neck muscles. No radiculopathy.  At last visit she had some issues with back pain. MRI could not be improved without her going to physical therapy for several weeks which she did not want to do. Back pain has improved. Was having some sciatica-type symptoms on the right.    Review of Systems     Objective:   Physical Exam funduscopic exam is benign bilaterally. PERRLA. Extraocular movements are full. TMs are clear. Brief neurological exam shows no focal deficits. She has palpable tightness in her trapezius muscles bilaterally in her sternocleidomastoid muscles bilaterally. Deep tendon reflexes 2+ and symmetrical muscle strength in her arms is normal.        Assessment & Plan:  Occipital headache-likely muscle contraction headache  Osteoporosis  Back pain  Plan: Appointment endocrinologist to discuss possible treatment with Prolia. With regard to occipital headache, ice neck down for 20 minutes  daily and take Aleve twice daily over the next 10 days to 2 weeks. Follow symptoms persist. Okay to go ahead with massage therapy.

## 2014-04-07 NOTE — Progress Notes (Signed)
   Subjective:    Patient ID: Maria Perkins, female    DOB: 04/28/1951, 63 y.o.   MRN: [id number]  HPI 63 year old white female with history of hypothyroidism and insomnia in today for health maintenance exam and evaluation of medical issues.  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 She improved. She never required surgery. History of osteopenia. Took Fosamax 70 mg weekly but this was discontinued in 2006 after being on it for a number of years. Had colonoscopy in 2001. Had tetanus immunization in 2005.  Social history: Married with 2 adult daughters. Husband is former English as a second language teacher of the [employer] and now works at [employer]. Patient does not smoke. Social alcohol consumption. 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  Family history: 2 brothers in good health. No sisters. Mother died at age 46 from complications of a stroke that she had in 1999-11-14. Father died with history of diabetes and dementia.    Review of Systems  Constitutional: Negative.  Negative for fatigue.  Respiratory: Negative.   Cardiovascular: Negative.   Endocrine: Negative.   All other systems reviewed and are negative.      Objective:   Physical Exam  Vitals reviewed. Constitutional: She is oriented to person, place, and time. She appears well-developed and well-nourished. No distress.  HENT:  Head: Normocephalic and atraumatic.  Right Ear: External ear normal.  Left Ear: External ear normal.  Nose: Nose normal.  Mouth/Throat: Oropharynx is clear and moist. No oropharyngeal exudate.  Eyes: Conjunctivae and EOM are normal. Pupils are equal, round, and reactive to light. Right eye exhibits no discharge. Left eye exhibits no discharge. No scleral icterus.  Neck: Neck supple. No JVD present. No thyromegaly present.  Cardiovascular: Normal rate, regular rhythm, normal  heart sounds and intact distal pulses.   No murmur heard. Pulmonary/Chest: Effort normal and breath sounds normal. No respiratory distress. She has no wheezes. She has no rales. She exhibits no tenderness.  Breasts normal female without masses  Abdominal: Soft. Bowel sounds are normal. She exhibits no mass. There is no rebound and no guarding.  Genitourinary: No vaginal discharge found.  Pap taken bimanual normal  Musculoskeletal: Normal range of motion. She exhibits no edema and no tenderness.  Lymphadenopathy:    She has no cervical adenopathy.  Neurological: She is alert and oriented to person, place, and time. She has normal reflexes. She displays normal reflexes. No cranial nerve deficit. Coordination normal.  Skin: Skin is warm and dry. No rash noted. She is not diaphoretic.  Psychiatric: She has a normal mood and affect. Her behavior is normal. Judgment and thought content normal.          Assessment & Plan:  Normal health maintenance exam including Pap smear  Hypothyroidism-TSH within normal limits on current dose of Synthroid  Plan: Return in 6-12 months  or as needed.  Tetanus and influenza immunizations given

## 2014-04-19 ENCOUNTER — Encounter: Payer: Self-pay | Admitting: Internal Medicine

## 2014-04-28 ENCOUNTER — Other Ambulatory Visit: Payer: BC Managed Care – PPO | Admitting: Internal Medicine

## 2014-04-28 DIAGNOSIS — Z13220 Encounter for screening for lipoid disorders: Secondary | ICD-10-CM

## 2014-04-28 DIAGNOSIS — E039 Hypothyroidism, unspecified: Secondary | ICD-10-CM

## 2014-04-28 LAB — LIPID PANEL
Cholesterol: 187 mg/dL (ref 0–200)
HDL: 82 mg/dL
LDL Cholesterol: 92 mg/dL (ref 0–99)
Total CHOL/HDL Ratio: 2.3 ratio
Triglycerides: 63 mg/dL
VLDL: 13 mg/dL (ref 0–40)

## 2014-04-28 LAB — TSH: TSH: 0.336 u[IU]/mL — ABNORMAL LOW (ref 0.350–4.500)

## 2014-04-29 ENCOUNTER — Encounter: Payer: Self-pay | Admitting: Internal Medicine

## 2014-04-29 ENCOUNTER — Ambulatory Visit (INDEPENDENT_AMBULATORY_CARE_PROVIDER_SITE_OTHER): Payer: BC Managed Care – PPO | Admitting: Internal Medicine

## 2014-04-29 VITALS — BP 100/60 | HR 80 | Temp 98.1°F | Wt 128.0 lb

## 2014-04-29 DIAGNOSIS — E039 Hypothyroidism, unspecified: Secondary | ICD-10-CM

## 2014-04-29 DIAGNOSIS — G4700 Insomnia, unspecified: Secondary | ICD-10-CM

## 2014-04-29 DIAGNOSIS — M810 Age-related osteoporosis without current pathological fracture: Secondary | ICD-10-CM

## 2014-04-30 ENCOUNTER — Other Ambulatory Visit: Payer: Self-pay

## 2014-04-30 DIAGNOSIS — G4700 Insomnia, unspecified: Secondary | ICD-10-CM

## 2014-04-30 MED ORDER — ZOLPIDEM TARTRATE 10 MG PO TABS: 10.0000 mg | ORAL_TABLET | Freq: Every evening | ORAL | Status: DC | PRN

## 2014-04-30 NOTE — Progress Notes (Signed)
   Subjective:    Patient ID: KHALESSI BLOUGH, female    DOB: 1951-03-10, 63 y.o.   MRN: [id number]  HPI Six-month recheck on hypothyroidism and insomnia. Has been a bit tired lately but has been traveling. She did go see Dr. Eden Emms regarding bone loss. They had a good discussion. She is considering taking Reclast. She will give this a little more thought however. Osteoporosis runs in her family. Still has intermittent insomnia. Left femoral neck T score -2.26 December 2013.    Review of Systems     Objective:   Physical Exam No thyromegaly. Chest clear to auscultation. Cardiac exam regular rate and rhythm. Extremities without edema.       Assessment & Plan:  Hypothyroidism-TSH is actually low but since she's complaining of fatigue and we'll not change the dose at this point and recheck in 6 months  Osteoporosis-considering Reclast  Intermittent insomnia-treated with Ambien which will be refilled for 6 months  Plan: But physical exam in 6 months continue same medications.  Long discussion about Reclast therapy and her concerns.  25 minutes spent with patient

## 2014-04-30 NOTE — Patient Instructions (Addendum)
Consider Reclast therapy. The physical exam in 6 months. Continue same medications.

## 2014-06-20 ENCOUNTER — Other Ambulatory Visit: Payer: Self-pay | Admitting: Internal Medicine

## 2014-07-08 ENCOUNTER — Encounter: Payer: Self-pay | Admitting: Internal Medicine

## 2014-08-05 ENCOUNTER — Other Ambulatory Visit (HOSPITAL_COMMUNITY): Payer: Self-pay | Admitting: *Deleted

## 2014-08-06 ENCOUNTER — Encounter (HOSPITAL_COMMUNITY)
Admission: RE | Admit: 2014-08-06 | Discharge: 2014-08-06 | Disposition: A | Discharge status: Home / Self Care | Payer: BC Managed Care – PPO | Source: Ambulatory Visit | Attending: Endocrinology | Admitting: Endocrinology

## 2014-08-06 DIAGNOSIS — M810 Age-related osteoporosis without current pathological fracture: Secondary | ICD-10-CM | POA: Diagnosis present

## 2014-08-06 MED ORDER — ZOLEDRONIC ACID 5 MG/100ML IV SOLN
INTRAVENOUS | Status: AC
  Filled 2014-08-06: qty 100

## 2014-08-06 MED ORDER — ZOLEDRONIC ACID 5 MG/100ML IV SOLN
5.0000 mg | Freq: Once | INTRAVENOUS | Status: AC
  Administered 2014-08-06: 5 mg via INTRAVENOUS

## 2014-08-06 NOTE — Discharge Instructions (Signed)

## 2014-10-27 ENCOUNTER — Other Ambulatory Visit: Payer: BC Managed Care – PPO | Admitting: Internal Medicine

## 2014-10-27 ENCOUNTER — Other Ambulatory Visit: Payer: Self-pay | Admitting: Internal Medicine

## 2014-10-27 DIAGNOSIS — Z0000 Encounter for general adult medical examination without abnormal findings: Secondary | ICD-10-CM

## 2014-10-27 DIAGNOSIS — E039 Hypothyroidism, unspecified: Secondary | ICD-10-CM

## 2014-10-27 DIAGNOSIS — Z1321 Encounter for screening for nutritional disorder: Secondary | ICD-10-CM

## 2014-10-27 DIAGNOSIS — Z130 Encounter for screening for diseases of the blood and blood-forming organs and certain disorders involving the immune mechanism: Secondary | ICD-10-CM

## 2014-10-27 DIAGNOSIS — Z1329 Encounter for screening for other suspected endocrine disorder: Secondary | ICD-10-CM

## 2014-10-27 DIAGNOSIS — Z13220 Encounter for screening for lipoid disorders: Secondary | ICD-10-CM

## 2014-10-27 LAB — COMPREHENSIVE METABOLIC PANEL
ALK PHOS: 44 U/L (ref 39–117)
ALT: 14 U/L (ref 0–35)
AST: 25 U/L (ref 0–37)
Albumin: 4.5 g/dL (ref 3.5–5.2)
BUN: 16 mg/dL (ref 6–23)
CALCIUM: 10.1 mg/dL (ref 8.4–10.5)
CO2: 27 mEq/L (ref 19–32)
Chloride: 101 mEq/L (ref 96–112)
Creat: 0.77 mg/dL (ref 0.50–1.10)
GLUCOSE: 85 mg/dL (ref 70–99)
Potassium: 4.7 mEq/L (ref 3.5–5.3)
Sodium: 136 mEq/L (ref 135–145)
Total Bilirubin: 2.2 mg/dL — ABNORMAL HIGH (ref 0.2–1.2)
Total Protein: 7 g/dL (ref 6.0–8.3)

## 2014-10-27 LAB — CBC WITH DIFFERENTIAL/PLATELET
BASOS PCT: 0 % (ref 0–1)
Basophils Absolute: 0 10*3/uL (ref 0.0–0.1)
EOS PCT: 2 % (ref 0–5)
Eosinophils Absolute: 0.1 10*3/uL (ref 0.0–0.7)
HEMATOCRIT: 44.4 % (ref 36.0–46.0)
Hemoglobin: 14.7 g/dL (ref 12.0–15.0)
Lymphocytes Relative: 40 % (ref 12–46)
Lymphs Abs: 1.8 10*3/uL (ref 0.7–4.0)
MCH: 31 pg (ref 26.0–34.0)
MCHC: 33.1 g/dL (ref 30.0–36.0)
MCV: 93.7 fL (ref 78.0–100.0)
MONO ABS: 0.4 10*3/uL (ref 0.1–1.0)
MONOS PCT: 9 % (ref 3–12)
MPV: 10 fL (ref 9.4–12.4)
NEUTROS ABS: 2.2 10*3/uL (ref 1.7–7.7)
Neutrophils Relative %: 49 % (ref 43–77)
Platelets: 242 10*3/uL (ref 150–400)
RBC: 4.74 MIL/uL (ref 3.87–5.11)
RDW: 13.8 % (ref 11.5–15.5)
WBC: 4.5 10*3/uL (ref 4.0–10.5)

## 2014-10-27 LAB — LIPID PANEL
Cholesterol: 199 mg/dL (ref 0–200)
HDL: 86 mg/dL (ref >39)
LDL CALC: 100 mg/dL — AB (ref 0–99)
TRIGLYCERIDES: 67 mg/dL (ref <150)
Total CHOL/HDL Ratio: 2.3 Ratio
VLDL: 13 mg/dL (ref 0–40)

## 2014-10-28 ENCOUNTER — Ambulatory Visit (INDEPENDENT_AMBULATORY_CARE_PROVIDER_SITE_OTHER): Payer: BC Managed Care – PPO | Admitting: Internal Medicine

## 2014-10-28 ENCOUNTER — Telehealth: Payer: Self-pay

## 2014-10-28 ENCOUNTER — Encounter: Payer: Self-pay | Admitting: Internal Medicine

## 2014-10-28 VITALS — BP 100/62 | HR 74 | Temp 97.5°F | Ht 63.0 in | Wt 131.0 lb

## 2014-10-28 DIAGNOSIS — M810 Age-related osteoporosis without current pathological fracture: Secondary | ICD-10-CM

## 2014-10-28 DIAGNOSIS — Z23 Encounter for immunization: Secondary | ICD-10-CM | POA: Diagnosis not present

## 2014-10-28 DIAGNOSIS — E039 Hypothyroidism, unspecified: Secondary | ICD-10-CM | POA: Diagnosis not present

## 2014-10-28 DIAGNOSIS — R17 Unspecified jaundice: Secondary | ICD-10-CM | POA: Diagnosis not present

## 2014-10-28 DIAGNOSIS — G4700 Insomnia, unspecified: Secondary | ICD-10-CM

## 2014-10-28 DIAGNOSIS — Z0000 Encounter for general adult medical examination without abnormal findings: Secondary | ICD-10-CM

## 2014-10-28 LAB — BILIRUBIN, FRACTIONATED(TOT/DIR/INDIR)
Bilirubin, Direct: 0.4 mg/dL — ABNORMAL HIGH (ref 0.0–0.3)
Indirect Bilirubin: 1.7 mg/dL — ABNORMAL HIGH (ref 0.2–1.2)
Total Bilirubin: 2.1 mg/dL — ABNORMAL HIGH (ref 0.2–1.2)

## 2014-10-28 LAB — HEPATIC FUNCTION PANEL
ALBUMIN: 4.5 g/dL (ref 3.5–5.2)
ALK PHOS: 43 U/L (ref 39–117)
ALT: 14 U/L (ref 0–35)
AST: 28 U/L (ref 0–37)
BILIRUBIN DIRECT: 0.4 mg/dL — AB (ref 0.0–0.3)
BILIRUBIN INDIRECT: 1.6 mg/dL — AB (ref 0.2–1.2)
BILIRUBIN TOTAL: 2 mg/dL — AB (ref 0.2–1.2)
Total Protein: 7.1 g/dL (ref 6.0–8.3)

## 2014-10-28 LAB — POCT URINALYSIS DIPSTICK
Bilirubin, UA: NEGATIVE
Glucose, UA: NEGATIVE
KETONES UA: NEGATIVE
LEUKOCYTES UA: NEGATIVE
Nitrite, UA: NEGATIVE
PH UA: 5
PROTEIN UA: NEGATIVE
RBC UA: NEGATIVE
Spec Grav, UA: <=1.005
Urobilinogen, UA: NEGATIVE

## 2014-10-28 LAB — VITAMIN D 25 HYDROXY (VIT D DEFICIENCY, FRACTURES): Vit D, 25-Hydroxy: 29 ng/mL — ABNORMAL LOW (ref 30–100)

## 2014-10-28 LAB — TSH: TSH: 0.256 u[IU]/mL — ABNORMAL LOW (ref 0.350–4.500)

## 2014-10-28 MED ORDER — LEVOTHYROXINE SODIUM 112 MCG PO TABS: 112.0000 ug | ORAL_TABLET | Freq: Every day | ORAL | Status: DC

## 2014-10-28 NOTE — Telephone Encounter (Signed)
Call to [facility] added bilirubin fractionated into direct and indirect.

## 2014-10-28 NOTE — Progress Notes (Signed)
   Subjective:    Patient ID: Maria Perkins, female    DOB: 09/13/1951, 63 y.o.   MRN: [id number]  HPI 63 year old Female in today for health maintenance exam. She will turn 64 January 13th. She has a history of hypothyroidism and is on thyroid replacement therapy. History of insomnia.  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 She improved and never required surgery. History of osteopenia. She took Fosamax 70 mg weekly but this was discontinued in 2006 after being on it for a number of years. Has had colonoscopy in 2001.  Social history: She's married with 2 adult daughters. Husband is former English as a second language teacher of the [employer]. He now works at [employer]. Patient does not smoke. Social alcohol consumption. 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  Family history: 2 brothers in good health. No sisters. Mother died at age 95 from complications of a stroke that occurred in 10/24/1999. Father died with history of diabetes and dementia.    Review of Systems  Constitutional: Negative.   All other systems reviewed and are negative.      Objective:   Physical Exam  Constitutional: She is oriented to person, place, and time. She appears well-developed and well-nourished.  HENT:  Head: Normocephalic and atraumatic.  Right Ear: External ear normal.  Left Ear: External ear normal.  Mouth/Throat: Oropharynx is clear and moist. No oropharyngeal exudate.  Eyes: Conjunctivae and EOM are normal. Pupils are equal, round, and reactive to light. Right eye exhibits no discharge. Left eye exhibits no discharge. No scleral icterus.  Neck: Neck supple. No JVD present. No thyromegaly present.  Cardiovascular: Normal rate, regular rhythm, normal heart sounds and intact distal pulses.   No murmur heard. Pulmonary/Chest: Effort normal and breath  sounds normal. No respiratory distress. She has no wheezes. She has no rales. She exhibits no tenderness.  Breasts normal female without masses  Abdominal: Soft. Bowel sounds are normal. She exhibits no distension. There is no tenderness. There is no rebound and no guarding.  Genitourinary:  Pap taken 2014. Bimanual normal.  Musculoskeletal: She exhibits no edema.  Lymphadenopathy:    She has no cervical adenopathy.  Neurological: She is alert and oriented to person, place, and time. She has normal reflexes. No cranial nerve deficit. Coordination normal.  Skin: Skin is warm and dry. No rash noted. She is not diaphoretic.  Psychiatric: She has a normal mood and affect. Her behavior is normal. Judgment and thought content normal.  Vitals reviewed.         Assessment & Plan:  Hypothyroidism  Insomnia  Elevated bilirubin  Osteoporosis-in 2015 she had a T score in the left femoral neck of  -2.5 and in the LS spine of -2.2. She saw Dr. Eden Emms in 2015. They discussed Reclast.  Plan: Repeat liver functions in 4 weeks. Have ultrasound of abdomen to look at liver for abnormalities. Continue same dose of thyroid replacement and when necessary Ambien.

## 2014-10-29 ENCOUNTER — Other Ambulatory Visit: Payer: Self-pay | Admitting: Internal Medicine

## 2014-10-29 ENCOUNTER — Telehealth: Payer: Self-pay

## 2014-10-29 DIAGNOSIS — R17 Unspecified jaundice: Secondary | ICD-10-CM

## 2014-10-29 NOTE — Telephone Encounter (Signed)
Abdominal Ultrasound scheduled at [facility] 10/30/2014 at 11 am [address] nothing to eat or drink after 12.  Patient aware.

## 2014-10-29 NOTE — Telephone Encounter (Signed)
-----   Message from Elby Showers, MD sent at 10/29/2014 12:41 PM EST ----- Let's go ahead with ultrasound of gallbladder liver and pancreas. Dx elevated bilirubin. I want to see her again and repeat this nonfasting in 4-6 weeks.

## 2014-10-30 ENCOUNTER — Ambulatory Visit
Admission: RE | Admit: 2014-10-30 | Discharge: 2014-10-30 | Disposition: A | Discharge status: Home / Self Care | Payer: BC Managed Care – PPO | Source: Ambulatory Visit | Attending: Internal Medicine | Admitting: Internal Medicine

## 2014-10-30 DIAGNOSIS — R17 Unspecified jaundice: Secondary | ICD-10-CM

## 2014-10-31 ENCOUNTER — Telehealth: Payer: Self-pay

## 2014-10-31 NOTE — Telephone Encounter (Signed)
-----   Message from Elby Showers, MD sent at 10/31/2014  9:57 AM EST ----- Please call patient. Result is negative. Repeat test in a few weeks as previously advised NONFASTING

## 2014-10-31 NOTE — Telephone Encounter (Signed)
Patient aware of ultrasound results and reminded of lab appointment.

## 2014-12-09 ENCOUNTER — Other Ambulatory Visit: Payer: Medicare Other | Admitting: Internal Medicine

## 2014-12-09 ENCOUNTER — Other Ambulatory Visit: Payer: Self-pay | Admitting: Internal Medicine

## 2014-12-09 DIAGNOSIS — Z130 Encounter for screening for diseases of the blood and blood-forming organs and certain disorders involving the immune mechanism: Secondary | ICD-10-CM | POA: Diagnosis not present

## 2014-12-09 DIAGNOSIS — E119 Type 2 diabetes mellitus without complications: Secondary | ICD-10-CM | POA: Diagnosis not present

## 2014-12-09 DIAGNOSIS — Z1329 Encounter for screening for other suspected endocrine disorder: Secondary | ICD-10-CM | POA: Diagnosis not present

## 2014-12-09 DIAGNOSIS — Z1321 Encounter for screening for nutritional disorder: Secondary | ICD-10-CM | POA: Diagnosis not present

## 2014-12-09 DIAGNOSIS — Z79899 Other long term (current) drug therapy: Secondary | ICD-10-CM

## 2014-12-09 DIAGNOSIS — Z13220 Encounter for screening for lipoid disorders: Secondary | ICD-10-CM | POA: Diagnosis not present

## 2014-12-09 DIAGNOSIS — Z0000 Encounter for general adult medical examination without abnormal findings: Secondary | ICD-10-CM | POA: Diagnosis not present

## 2014-12-09 LAB — HEPATIC FUNCTION PANEL
ALBUMIN: 4.3 g/dL (ref 3.5–5.2)
ALT: 12 U/L (ref 0–35)
AST: 23 U/L (ref 0–37)
Alkaline Phosphatase: 48 U/L (ref 39–117)
BILIRUBIN INDIRECT: 1 mg/dL (ref 0.2–1.2)
Bilirubin, Direct: 0.2 mg/dL (ref 0.0–0.3)
TOTAL PROTEIN: 6.9 g/dL (ref 6.0–8.3)
Total Bilirubin: 1.2 mg/dL (ref 0.2–1.2)

## 2014-12-09 NOTE — Telephone Encounter (Signed)
ambien script called in to [facility]

## 2014-12-09 NOTE — Telephone Encounter (Signed)
Refill x 6 months 

## 2014-12-29 ENCOUNTER — Encounter: Payer: Self-pay | Admitting: Gastroenterology

## 2015-01-02 DIAGNOSIS — H33309 Unspecified retinal break, unspecified eye: Secondary | ICD-10-CM | POA: Diagnosis not present

## 2015-01-02 DIAGNOSIS — H43812 Vitreous degeneration, left eye: Secondary | ICD-10-CM | POA: Diagnosis not present

## 2015-01-02 DIAGNOSIS — H35379 Puckering of macula, unspecified eye: Secondary | ICD-10-CM | POA: Diagnosis not present

## 2015-01-02 DIAGNOSIS — H43813 Vitreous degeneration, bilateral: Secondary | ICD-10-CM | POA: Diagnosis not present

## 2015-01-06 ENCOUNTER — Encounter: Payer: Self-pay | Admitting: Internal Medicine

## 2015-01-22 ENCOUNTER — Other Ambulatory Visit: Payer: Medicare Other | Admitting: Internal Medicine

## 2015-01-22 DIAGNOSIS — E039 Hypothyroidism, unspecified: Secondary | ICD-10-CM

## 2015-01-22 LAB — TSH: TSH: 0.69 u[IU]/mL (ref 0.350–4.500)

## 2015-01-23 ENCOUNTER — Telehealth: Payer: Self-pay | Admitting: *Deleted

## 2015-01-23 NOTE — Telephone Encounter (Signed)
Reviewed lab results and instructions with patient

## 2015-01-27 ENCOUNTER — Other Ambulatory Visit: Payer: BC Managed Care – PPO | Admitting: Internal Medicine

## 2015-01-27 DIAGNOSIS — H52223 Regular astigmatism, bilateral: Secondary | ICD-10-CM | POA: Diagnosis not present

## 2015-01-27 DIAGNOSIS — H35372 Puckering of macula, left eye: Secondary | ICD-10-CM | POA: Diagnosis not present

## 2015-01-27 DIAGNOSIS — H5202 Hypermetropia, left eye: Secondary | ICD-10-CM | POA: Diagnosis not present

## 2015-02-09 ENCOUNTER — Encounter (INDEPENDENT_AMBULATORY_CARE_PROVIDER_SITE_OTHER): Payer: Medicare Other | Admitting: Ophthalmology

## 2015-02-09 DIAGNOSIS — H2513 Age-related nuclear cataract, bilateral: Secondary | ICD-10-CM

## 2015-02-09 DIAGNOSIS — H35372 Puckering of macula, left eye: Secondary | ICD-10-CM | POA: Diagnosis not present

## 2015-02-09 DIAGNOSIS — H43813 Vitreous degeneration, bilateral: Secondary | ICD-10-CM | POA: Diagnosis not present

## 2015-02-09 DIAGNOSIS — H33302 Unspecified retinal break, left eye: Secondary | ICD-10-CM

## 2015-02-09 DIAGNOSIS — D3131 Benign neoplasm of right choroid: Secondary | ICD-10-CM

## 2015-02-09 DIAGNOSIS — H3531 Nonexudative age-related macular degeneration: Secondary | ICD-10-CM | POA: Diagnosis not present

## 2015-02-14 NOTE — Patient Instructions (Signed)
Have ultrasound of the abdomen because of elevated bilirubin. Repeat liver functions in 4 weeks. Continue same dose of Synthroid.

## 2015-02-17 ENCOUNTER — Encounter: Payer: Self-pay | Admitting: Internal Medicine

## 2015-02-23 ENCOUNTER — Ambulatory Visit (INDEPENDENT_AMBULATORY_CARE_PROVIDER_SITE_OTHER): Payer: Medicare Other | Admitting: Ophthalmology

## 2015-02-23 DIAGNOSIS — H33302 Unspecified retinal break, left eye: Secondary | ICD-10-CM

## 2015-03-18 DIAGNOSIS — H3531 Nonexudative age-related macular degeneration: Secondary | ICD-10-CM | POA: Diagnosis not present

## 2015-03-30 DIAGNOSIS — J189 Pneumonia, unspecified organism: Secondary | ICD-10-CM | POA: Diagnosis not present

## 2015-03-30 DIAGNOSIS — J029 Acute pharyngitis, unspecified: Secondary | ICD-10-CM | POA: Diagnosis not present

## 2015-04-01 ENCOUNTER — Telehealth: Payer: Self-pay | Admitting: Internal Medicine

## 2015-04-01 NOTE — Telephone Encounter (Signed)
[facility] by Lilyan Punt, NP on Monday, 5/9.  Had sore throat, fever and just felt terrible.  They did a rapid strept test which was negative.  Fever 101.  Decreased breath sounds.  Thought to maybe have pneumonia, but chest x-ray was NOT done.  Was started on Rx Z-pac.  She has had night sweats for the past 2 nights.  Today is first day without fever since Saturday.  Today temperature is 98.2  Says she is feeling much better today.  She is having productive cough with yellow mucous.  Nothing was given for the cough.    States she called our office on Monday and machine stated we opened at 9:00 and the [facility] at 8:30, so she opted [facility] to [facility] instead of waiting to be seen here.  The NP at the [facility] asked her to follow up with her doctor's office, so she is calling to advise of her visit and how she's feeling today.

## 2015-04-07 ENCOUNTER — Ambulatory Visit (AMBULATORY_SURGERY_CENTER): Payer: Self-pay | Admitting: *Deleted

## 2015-04-07 VITALS — Ht 64.5 in | Wt 127.6 lb

## 2015-04-07 DIAGNOSIS — Z86010 Personal history of colonic polyps: Secondary | ICD-10-CM

## 2015-04-07 NOTE — Progress Notes (Signed)
No egg or soy allergy No issues with past sedation No home 02 No diet pills emmi declined  

## 2015-04-14 ENCOUNTER — Other Ambulatory Visit: Payer: Self-pay | Admitting: Internal Medicine

## 2015-04-23 ENCOUNTER — Ambulatory Visit (AMBULATORY_SURGERY_CENTER): Payer: Medicare Other | Admitting: Internal Medicine

## 2015-04-23 ENCOUNTER — Encounter: Payer: Self-pay | Admitting: Internal Medicine

## 2015-04-23 VITALS — BP 102/63 | HR 59 | Temp 97.9°F | Resp 54 | Ht 64.0 in | Wt 127.0 lb

## 2015-04-23 DIAGNOSIS — Z86010 Personal history of colonic polyps: Secondary | ICD-10-CM

## 2015-04-23 DIAGNOSIS — D128 Benign neoplasm of rectum: Secondary | ICD-10-CM

## 2015-04-23 DIAGNOSIS — K621 Rectal polyp: Secondary | ICD-10-CM

## 2015-04-23 DIAGNOSIS — D129 Benign neoplasm of anus and anal canal: Secondary | ICD-10-CM

## 2015-04-23 DIAGNOSIS — Z1211 Encounter for screening for malignant neoplasm of colon: Secondary | ICD-10-CM | POA: Diagnosis not present

## 2015-04-23 MED ORDER — SODIUM CHLORIDE 0.9 % IV SOLN
500.0000 mL | INTRAVENOUS | Status: DC
Start: 2015-04-23 — End: 2015-04-24

## 2015-04-23 NOTE — Patient Instructions (Addendum)
YOU HAD AN ENDOSCOPIC PROCEDURE TODAY AT [facility]:   Refer to the procedure report that was given to you for any specific questions about what was found during the examination.  If the procedure report does not answer your questions, please call your gastroenterologist to clarify.  If you requested that your care partner not be given the details of your procedure findings, then the procedure report has been included in a sealed envelope for you to review at your convenience later.  YOU SHOULD EXPECT: Some feelings of bloating in the abdomen. Passage of more gas than usual.  Walking can help get rid of the air that was put into your GI tract during the procedure and reduce the bloating. If you had a lower endoscopy (such as a colonoscopy or flexible sigmoidoscopy) you may notice spotting of blood in your stool or on the toilet paper. If you underwent a bowel prep for your procedure, you may not have a normal bowel movement for a few days.  Please Note:  You might notice some irritation and congestion in your nose or some drainage.  This is from the oxygen used during your procedure.  There is no need for concern and it should clear up in a day or so.  SYMPTOMS TO REPORT IMMEDIATELY:   Following lower endoscopy (colonoscopy or flexible sigmoidoscopy):  Excessive amounts of blood in the stool  Significant tenderness or worsening of abdominal pains  Swelling of the abdomen that is new, acute  Fever of 100F or higher  For urgent or emergent issues, a gastroenterologist can be reached at any hour by calling [phone].   DIET: Your first meal following the procedure should be a small meal and then it is ok to progress to your normal diet. Heavy or fried foods are harder to digest and may make you feel nauseous or bloated.  Likewise, meals heavy in dairy and vegetables can increase bloating.  Drink plenty of fluids but you should avoid alcoholic beverages for 24 hours. Try to eat  a high fiber diet.  ACTIVITY:  You should plan to take it easy for the rest of today and you should NOT DRIVE or use heavy machinery until tomorrow (because of the sedation medicines used during the test).    FOLLOW UP: Our staff will call the number listed on your records the next business day following your procedure to check on you and address any questions or concerns that you may have regarding the information given to you following your procedure. If we do not reach you, we will leave a message.  However, if you are feeling well and you are not experiencing any problems, there is no need to return our call.  We will assume that you have returned to your regular daily activities without incident.  If any biopsies were taken you will be contacted by phone or by letter within the next 1-3 weeks.  Please call us at [phone] if you have not heard about the biopsies in 3 weeks.    SIGNATURES/CONFIDENTIALITY: You and/or your care partner have signed paperwork which will be entered into your electronic medical record.  These signatures attest to the fact that that the information above on your After Visit Summary has been reviewed and is understood.  Full responsibility of the confidentiality of this discharge information lies with you and/or your care-partner.  Read all handouts given to you by your recovery room nurse.

## 2015-04-23 NOTE — Progress Notes (Signed)
Called to room to assist during endoscopic procedure.  Patient ID and intended procedure confirmed with present staff. Received instructions for my participation in the procedure from the performing physician.  

## 2015-04-23 NOTE — Progress Notes (Signed)
A/ox3 pleased with MAC, report to Suzanne RN 

## 2015-04-23 NOTE — Op Note (Signed)
[facility]  [address] [place], [id number]   COLONOSCOPY PROCEDURE REPORT  PATIENT: Maria Perkins, Maria Perkins  MR#: [id number] BIRTHDATE: 10-02-1951 , 47  yrs. old GENDER: female ENDOSCOPIST: Lafayette Dragon, MD REFERRED JO:ITGP Parke Simmers, M.D. PROCEDURE DATE:  04/23/2015 PROCEDURE:   Colonoscopy, screening and Colonoscopy with cold biopsy polypectomy First Screening Colonoscopy - Avg.  risk and is 50 yrs.  old or older - No.  Prior Negative Screening - Now for repeat screening. N/A  History of Adenoma - Now for follow-up colonoscopy & has been > or = to 3 yrs.  Yes hx of adenoma.  Has been 3 or more years since last colonoscopy.  Polyps removed today? Yes ASA CLASS:   Class I INDICATIONS:Surveillance due to prior colonic neoplasia, Colorectal Neoplasm Risk Assessment for this procedure is average risk, and prior colonoscopy in 2001 and in March 2011.  Tubular adenoma removed.  Family history of colon polyps in mother and brother. MEDICATIONS: Monitored anesthesia care and Propofol 200 mg IV  DESCRIPTION OF PROCEDURE:   After the risks benefits and alternatives of the procedure were thoroughly explained, informed consent was obtained.  The digital rectal exam revealed no abnormalities of the rectum.   The LB [id number]  endoscope was introduced through the anus and advanced to the cecum, which was identified by both the appendix and ileocecal valve. No adverse events experienced.   The quality of the prep was good.  (MoviPrep was used)  The instrument was then slowly withdrawn as the colon was fully examined. Estimated blood loss is zero unless otherwise noted in this procedure report.      COLON FINDINGS: A sessile polyp measuring 3 mm in size was found in the rectum.  A polypectomy was performed with cold forceps.  The resection was complete, the polyp tissue was completely retrieved and sent to histology.  Retroflexed views revealed no abnormalities.  The time to cecum = 6.10 Withdrawal time = 7.33 The scope was withdrawn and the procedure completed. COMPLICATIONS: There were no immediate complications.  ENDOSCOPIC IMPRESSION: Sessile polyp was found in the rectum; polypectomy was performed with cold forceps  RECOMMENDATIONS: 1.  Await pathology results 2.  High-fiber diet Recall colonoscopy pending path report  eSigned:  Lafayette Dragon, MD 04/23/2015 9:02 AM   cc:   PATIENT NAME:  Maria Perkins MR#: [id number]

## 2015-04-24 ENCOUNTER — Telehealth: Payer: Self-pay | Admitting: *Deleted

## 2015-04-24 NOTE — Telephone Encounter (Signed)
No answer, left message to call if questions or concerns. 

## 2015-04-28 ENCOUNTER — Other Ambulatory Visit: Payer: Medicare Other | Admitting: Internal Medicine

## 2015-04-28 DIAGNOSIS — E039 Hypothyroidism, unspecified: Secondary | ICD-10-CM

## 2015-04-28 LAB — TSH: TSH: 0.91 u[IU]/mL (ref 0.350–4.500)

## 2015-04-29 ENCOUNTER — Telehealth: Payer: Self-pay | Admitting: *Deleted

## 2015-04-29 NOTE — Telephone Encounter (Signed)
Reviewed lab results with patient.

## 2015-04-30 ENCOUNTER — Ambulatory Visit: Payer: BC Managed Care – PPO | Admitting: Internal Medicine

## 2015-04-30 ENCOUNTER — Encounter: Payer: Self-pay | Admitting: Internal Medicine

## 2015-05-07 ENCOUNTER — Ambulatory Visit (INDEPENDENT_AMBULATORY_CARE_PROVIDER_SITE_OTHER): Payer: Medicare Other | Admitting: Internal Medicine

## 2015-05-07 ENCOUNTER — Encounter: Payer: Self-pay | Admitting: Internal Medicine

## 2015-05-07 VITALS — BP 108/64 | HR 67 | Temp 97.2°F | Wt 129.0 lb

## 2015-05-07 DIAGNOSIS — E039 Hypothyroidism, unspecified: Secondary | ICD-10-CM

## 2015-05-07 DIAGNOSIS — L814 Other melanin hyperpigmentation: Secondary | ICD-10-CM | POA: Diagnosis not present

## 2015-05-07 DIAGNOSIS — L570 Actinic keratosis: Secondary | ICD-10-CM | POA: Diagnosis not present

## 2015-05-07 DIAGNOSIS — G4700 Insomnia, unspecified: Secondary | ICD-10-CM | POA: Diagnosis not present

## 2015-05-07 DIAGNOSIS — D1801 Hemangioma of skin and subcutaneous tissue: Secondary | ICD-10-CM | POA: Diagnosis not present

## 2015-05-07 DIAGNOSIS — D225 Melanocytic nevi of trunk: Secondary | ICD-10-CM | POA: Diagnosis not present

## 2015-05-07 DIAGNOSIS — L821 Other seborrheic keratosis: Secondary | ICD-10-CM | POA: Diagnosis not present

## 2015-05-07 MED ORDER — ZOLPIDEM TARTRATE 5 MG PO TABS: 5.0000 mg | ORAL_TABLET | Freq: Every evening | ORAL | Status: DC | PRN

## 2015-05-16 NOTE — Patient Instructions (Addendum)
Continue same dose of levothyroxine. Ambien refilled. Return in December for physical exam.

## 2015-05-16 NOTE — Progress Notes (Signed)
   Subjective:    Patient ID: Maria Perkins, female    DOB: [birth year], 64 y.o.   MRN: [id number]  HPI In today for six-month recheck on hypothyroidism. Currently on levothyroxin 0.112 mg daily. Had hyperplastic sessile colon polyp removed by Dr. Olevia Perches earlier this month during colonoscopy. Also long-standing issues with insomnia. Needs Ambien refill. Only taking 5 mg at most at bedtime and usually just a half of that. Had been quartering a 10 mg tablet.  Review of Systems     Objective:   Physical Exam  No thyromegaly      Assessment & Plan:  Hypothyroidism  Insomnia  Plan: Refill Ambien as requested. Continue levothyroxine 0.112 mg daily. Physical exam due in December

## 2015-06-22 ENCOUNTER — Other Ambulatory Visit: Payer: Self-pay | Admitting: Internal Medicine

## 2015-07-06 ENCOUNTER — Ambulatory Visit (INDEPENDENT_AMBULATORY_CARE_PROVIDER_SITE_OTHER): Payer: Medicare Other | Admitting: Ophthalmology

## 2015-08-17 ENCOUNTER — Ambulatory Visit (INDEPENDENT_AMBULATORY_CARE_PROVIDER_SITE_OTHER): Payer: Medicare Other | Admitting: Ophthalmology

## 2015-08-17 DIAGNOSIS — H35372 Puckering of macula, left eye: Secondary | ICD-10-CM

## 2015-08-17 DIAGNOSIS — D3131 Benign neoplasm of right choroid: Secondary | ICD-10-CM

## 2015-08-17 DIAGNOSIS — H3531 Nonexudative age-related macular degeneration: Secondary | ICD-10-CM

## 2015-08-17 DIAGNOSIS — H33302 Unspecified retinal break, left eye: Secondary | ICD-10-CM

## 2015-08-17 DIAGNOSIS — H43813 Vitreous degeneration, bilateral: Secondary | ICD-10-CM

## 2015-08-19 ENCOUNTER — Ambulatory Visit (INDEPENDENT_AMBULATORY_CARE_PROVIDER_SITE_OTHER): Payer: Medicare Other | Admitting: Ophthalmology

## 2015-10-27 ENCOUNTER — Other Ambulatory Visit: Payer: Medicare Other | Admitting: Internal Medicine

## 2015-10-27 DIAGNOSIS — R17 Unspecified jaundice: Secondary | ICD-10-CM | POA: Diagnosis not present

## 2015-10-27 DIAGNOSIS — Z79899 Other long term (current) drug therapy: Secondary | ICD-10-CM | POA: Diagnosis not present

## 2015-10-27 DIAGNOSIS — Z0000 Encounter for general adult medical examination without abnormal findings: Secondary | ICD-10-CM

## 2015-10-27 DIAGNOSIS — M810 Age-related osteoporosis without current pathological fracture: Secondary | ICD-10-CM

## 2015-10-27 DIAGNOSIS — E039 Hypothyroidism, unspecified: Secondary | ICD-10-CM

## 2015-10-27 LAB — CBC WITH DIFFERENTIAL/PLATELET
BASOS ABS: 0.1 10*3/uL (ref 0.0–0.1)
Basophils Relative: 1 % (ref 0–1)
Eosinophils Absolute: 0.2 10*3/uL (ref 0.0–0.7)
Eosinophils Relative: 3 % (ref 0–5)
HEMATOCRIT: 43.6 % (ref 36.0–46.0)
HEMOGLOBIN: 14.5 g/dL (ref 12.0–15.0)
LYMPHS PCT: 32 % (ref 12–46)
Lymphs Abs: 1.7 10*3/uL (ref 0.7–4.0)
MCH: 30.8 pg (ref 26.0–34.0)
MCHC: 33.3 g/dL (ref 30.0–36.0)
MCV: 92.6 fL (ref 78.0–100.0)
MONO ABS: 0.4 10*3/uL (ref 0.1–1.0)
MPV: 10.1 fL (ref 8.6–12.4)
Monocytes Relative: 8 % (ref 3–12)
NEUTROS ABS: 3 10*3/uL (ref 1.7–7.7)
Neutrophils Relative %: 56 % (ref 43–77)
Platelets: 229 10*3/uL (ref 150–400)
RBC: 4.71 MIL/uL (ref 3.87–5.11)
RDW: 13.6 % (ref 11.5–15.5)
WBC: 5.4 10*3/uL (ref 4.0–10.5)

## 2015-10-27 LAB — LIPID PANEL
CHOL/HDL RATIO: 2.3 ratio (ref <=5.0)
CHOLESTEROL: 189 mg/dL (ref 125–200)
HDL: 84 mg/dL (ref >=46)
LDL Cholesterol: 92 mg/dL (ref <130)
TRIGLYCERIDES: 64 mg/dL (ref <150)
VLDL: 13 mg/dL (ref <30)

## 2015-10-27 LAB — COMPLETE METABOLIC PANEL WITH GFR
ALK PHOS: 53 U/L (ref 33–130)
ALT: 14 U/L (ref 6–29)
AST: 26 U/L (ref 10–35)
Albumin: 4.4 g/dL (ref 3.6–5.1)
BUN: 17 mg/dL (ref 7–25)
CALCIUM: 9.9 mg/dL (ref 8.6–10.4)
CO2: 28 mmol/L (ref 20–31)
Chloride: 103 mmol/L (ref 98–110)
Creat: 0.69 mg/dL (ref 0.50–0.99)
GFR, Est Non African American: >89 mL/min (ref >=60)
Glucose, Bld: 94 mg/dL (ref 65–99)
POTASSIUM: 5 mmol/L (ref 3.5–5.3)
SODIUM: 140 mmol/L (ref 135–146)
Total Bilirubin: 1.6 mg/dL — ABNORMAL HIGH (ref 0.2–1.2)
Total Protein: 6.7 g/dL (ref 6.1–8.1)

## 2015-10-27 LAB — TSH: TSH: 1.341 u[IU]/mL (ref 0.350–4.500)

## 2015-10-28 LAB — VITAMIN D 25 HYDROXY (VIT D DEFICIENCY, FRACTURES): Vit D, 25-Hydroxy: 41 ng/mL (ref 30–100)

## 2015-10-29 ENCOUNTER — Other Ambulatory Visit: Payer: Self-pay

## 2015-10-29 MED ORDER — LEVOTHYROXINE SODIUM 112 MCG PO TABS: ORAL_TABLET | ORAL | Status: DC

## 2015-10-29 NOTE — Telephone Encounter (Signed)
Pt has labs drawn on Tues; her TSH level is normal; her appt is Monday but she took her last dose of levothyroxine today.

## 2015-10-30 ENCOUNTER — Other Ambulatory Visit: Payer: Medicare Other | Admitting: Internal Medicine

## 2015-11-02 ENCOUNTER — Encounter: Payer: Self-pay | Admitting: Internal Medicine

## 2015-11-02 ENCOUNTER — Ambulatory Visit (INDEPENDENT_AMBULATORY_CARE_PROVIDER_SITE_OTHER): Payer: Medicare Other | Admitting: Internal Medicine

## 2015-11-02 ENCOUNTER — Other Ambulatory Visit (HOSPITAL_COMMUNITY)
Admission: RE | Admit: 2015-11-02 | Discharge: 2015-11-02 | Disposition: A | Discharge status: Home / Self Care | Payer: Medicare Other | Source: Ambulatory Visit | Attending: Internal Medicine | Admitting: Internal Medicine

## 2015-11-02 VITALS — BP 116/72 | HR 62 | Temp 97.6°F | Resp 18 | Ht 64.0 in | Wt 119.0 lb

## 2015-11-02 DIAGNOSIS — M810 Age-related osteoporosis without current pathological fracture: Secondary | ICD-10-CM

## 2015-11-02 DIAGNOSIS — R17 Unspecified jaundice: Secondary | ICD-10-CM

## 2015-11-02 DIAGNOSIS — Z124 Encounter for screening for malignant neoplasm of cervix: Secondary | ICD-10-CM

## 2015-11-02 DIAGNOSIS — E039 Hypothyroidism, unspecified: Secondary | ICD-10-CM | POA: Diagnosis not present

## 2015-11-02 DIAGNOSIS — Z0000 Encounter for general adult medical examination without abnormal findings: Secondary | ICD-10-CM

## 2015-11-02 DIAGNOSIS — K649 Unspecified hemorrhoids: Secondary | ICD-10-CM | POA: Diagnosis not present

## 2015-11-02 DIAGNOSIS — Z23 Encounter for immunization: Secondary | ICD-10-CM | POA: Diagnosis not present

## 2015-11-02 LAB — POCT URINALYSIS DIPSTICK
Bilirubin, UA: NEGATIVE
Blood, UA: NEGATIVE
GLUCOSE UA: NEGATIVE
Ketones, UA: NEGATIVE
LEUKOCYTES UA: NEGATIVE
NITRITE UA: NEGATIVE
PROTEIN UA: NEGATIVE
Spec Grav, UA: 1.01
UROBILINOGEN UA: 0.2
pH, UA: 7

## 2015-11-02 MED ORDER — HYDROCORTISONE ACE-PRAMOXINE 1-1 % RE FOAM: 1.0000 | Freq: Four times a day (QID) | RECTAL | Status: DC

## 2015-11-03 ENCOUNTER — Telehealth: Payer: Self-pay

## 2015-11-03 MED ORDER — HYDROCORTISONE 2.5 % RE CREA: 1.0000 "application " | TOPICAL_CREAM | Freq: Four times a day (QID) | RECTAL | Status: DC

## 2015-11-03 NOTE — Telephone Encounter (Signed)
Insurance will not cover proctofoam; changing to proctosol HC2.5% cream

## 2015-11-04 LAB — CYTOLOGY - PAP

## 2015-11-09 DIAGNOSIS — M810 Age-related osteoporosis without current pathological fracture: Secondary | ICD-10-CM | POA: Diagnosis not present

## 2015-11-12 DIAGNOSIS — M810 Age-related osteoporosis without current pathological fracture: Secondary | ICD-10-CM | POA: Diagnosis not present

## 2015-11-21 ENCOUNTER — Encounter: Payer: Self-pay | Admitting: Internal Medicine

## 2015-11-21 DIAGNOSIS — M810 Age-related osteoporosis without current pathological fracture: Secondary | ICD-10-CM | POA: Insufficient documentation

## 2015-11-21 DIAGNOSIS — R17 Unspecified jaundice: Secondary | ICD-10-CM | POA: Insufficient documentation

## 2015-11-21 NOTE — Progress Notes (Deleted)
   Subjective:    Patient ID: Maria Perkins, female    DOB: 24-May-1951, 64 y.o.   MRN: [id number]  HPI    Review of Systems     Objective:   Physical Exam        Assessment & Plan:

## 2015-11-21 NOTE — Progress Notes (Addendum)
Subjective:    Patient ID: Maria Perkins, female    DOB: 1951/06/25, 64 y.o.   MRN: [id number]  HPI 64 year old White Female in today for Welcome to Medicare physical examination. History of hypothyroidism on thyroid replacement therapy. Has some issues from time to time with hemorrhoids. Wants prescription medication. History of insomnia.  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 She improved and never required surgery. History of osteopenia. She took Fosamax 70 mg weekly but this was discontinued in 2006 after being on it for a number of years.  Social history: She's married with 2 adult daughters. Husband is former English as a second language teacher of the [employer]. He now works at [employer]. Patient does not smoke. Social alcohol consumption. 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 She is a Writer of [employer].  Family history: 2 brothers in good health. No sisters. Mother died at age 31 from convocation's of a stroke that occurred 1999-11-22. Father died with history of diabetes and dementia.        Review of Systems  Constitutional: Negative.   All other systems reviewed and are negative.      Objective:   Physical Exam  Constitutional: She is oriented to person, place, and time. She appears well-developed and well-nourished. No distress.  HENT:  Head: Normocephalic and atraumatic.  Right Ear: External ear normal.  Left Ear: External ear normal.  Mouth/Throat: Oropharynx is clear and moist.  Eyes: Conjunctivae and EOM are normal. Pupils are equal, round, and reactive to light. Right eye exhibits no discharge. Left eye exhibits no discharge. No scleral icterus.  Neck: Neck supple. No JVD present. No thyromegaly present.  Cardiovascular: Normal rate, regular rhythm and normal heart sounds.   No murmur  heard. Pulmonary/Chest: Effort normal and breath sounds normal. No respiratory distress. She has no wheezes. She has no rales.  Abdominal: Soft. Bowel sounds are normal. She exhibits no distension and no mass. There is no tenderness. There is no rebound and no guarding.  Genitourinary:  Pap taken. Bimanual normal. External hemorrhoids present  Musculoskeletal: She exhibits no edema.  Lymphadenopathy:    She has no cervical adenopathy.  Neurological: She is alert and oriented to person, place, and time. She has normal reflexes. No cranial nerve deficit. Coordination normal.  Skin: Skin is warm and dry. No rash noted. She is not diaphoretic.  Psychiatric: She has a normal mood and affect. Her behavior is normal. Judgment and thought content normal.  Vitals reviewed.         Assessment & Plan:  Hypothyroidism-stable on thyroid replacement therapy  External hemorrhoids-prescribed ProctoCream-HC and Anusol cream  History of insomnia-treated with Ambien  Osteoporosis-consider every 6 month injections. Discuss with endocrinologist  Elevated serum bilirubin-to have liver ultrasound. Addendum: liver ultrasound is normal  Plan: Continue same medications and return in 6 months. Total bilirubin mildly elevated at 1.6. Discuss osteoporosis treatment with endocrinologist. Please call for appointment.  Subjective:   Patient presents for Medicare Annual/Subsequent preventive examination.  Review Past Medical/Family/Social: See above   Risk Factors  Current exercise habits: . Physically active about the home Dietary issues discussed: Low fat low carbohydrate  Cardiac risk factors: Family history of stroke in mother  Depression Screen  (Note: if answer to either of the following is "Yes", a more complete depression screening is indicated)   Over the past  two weeks, have you felt down, depressed or hopeless? No  Over the past two weeks, have you felt little interest or pleasure in doing  things? No Have you lost interest or pleasure in daily life? No Do you often feel hopeless? No Do you cry easily over simple problems? No   Activities of Daily Living  In your present state of health, do you have any difficulty performing the following activities?:   Driving? No  Managing money? No  Feeding yourself? No  Getting from bed to chair? No  Climbing a flight of stairs? No  Preparing food and eating?: No  Bathing or showering? No  Getting dressed: No  Getting to the toilet? No  Using the toilet:No  Moving around from place to place: No  In the past year have you fallen or had a near fall?:No  Are you sexually active? No  Do you have more than one partner? No   Hearing Difficulties: No  Do you often ask people to speak up or repeat themselves? No  Do you experience ringing or noises in your ears? No  Do you have difficulty understanding soft or whispered voices? No  Do you feel that you have a problem with memory? No Do you often misplace items? No    Home Safety:  Do you have a smoke alarm at your residence? Yes Do you have grab bars in the bathroom? Yes Do you have throw rugs in your house? Yes   Cognitive Testing  Alert? Yes Normal Appearance?Yes  Oriented to person? Yes Place? Yes  Time? Yes  Recall of three objects? Yes  Can perform simple calculations? Yes  Displays appropriate judgment?Yes  Can read the correct time from a watch face?Yes   List the Names of Other Physician/Practitioners you currently use:  See referral list for the physicians patient is currently seeing.  Dr. Elyse Hsu   Review of Systems: See above   Objective:     General appearance: Appears younger than stated age Head: Normocephalic, without obvious abnormality, atraumatic  Eyes: conj clear, EOMi PEERLA  Ears: normal TM's and external ear canals both ears  Nose: Nares normal. Septum midline. Mucosa normal. No drainage or sinus tenderness.  Throat: lips, mucosa, and  tongue normal; teeth and gums normal  Neck: no adenopathy, no carotid bruit, no JVD, supple, symmetrical, trachea midline and thyroid not enlarged, symmetric, no tenderness/mass/nodules  No CVA tenderness.  Lungs: clear to auscultation bilaterally  Breasts: normal appearance, no masses or tenderness Heart: regular rate and rhythm, S1, S2 normal, no murmur, click, rub or gallop  Abdomen: soft, non-tender; bowel sounds normal; no masses, no organomegaly  Musculoskeletal: ROM normal in all joints, no crepitus, no deformity, Normal muscle strengthen. Back  is symmetric, no curvature. Skin: Skin color, texture, turgor normal. No rashes or lesions  Lymph nodes: Cervical, supraclavicular, and axillary nodes normal.  Neurologic: CN 2 -12 Normal, Normal symmetric reflexes. Normal coordination and gait  Psych: Alert & Oriented x 3, Mood appear stable.    Assessment:    Annual wellness medicare exam   Plan:    During the course of the visit the patient was educated and counseled about appropriate screening and preventive services including:  Osteoporosis treatment  Annual mammogram  Annual flu vaccine  Due for Prevnar     Patient Instructions (the written plan) was given to the patient.  Medicare Attestation  I have personally reviewed:  The patient's medical and social history  Their use of alcohol,  tobacco or illicit drugs  Their current medications and supplements  The patient's functional ability including ADLs,fall risks, home safety risks, cognitive, and hearing and visual impairment  Diet and physical activities  Evidence for depression or mood disorders  The patient's weight, height, BMI, and visual acuity have been recorded in the chart. I have made referrals, counseling, and provided education to the patient based on review of the above and I have provided the patient with a written personalized care plan for preventive services.

## 2015-11-21 NOTE — Patient Instructions (Addendum)
It was a pleasure to see you today. Continue same medications and return in 6 months. Call Dr. Altheimer's office regarding osteoporosis follow-up.  To have ultrasound of liver regarding elevated serum bilirubin.

## 2015-12-01 ENCOUNTER — Other Ambulatory Visit (HOSPITAL_COMMUNITY): Payer: Self-pay | Admitting: *Deleted

## 2015-12-02 ENCOUNTER — Other Ambulatory Visit (HOSPITAL_COMMUNITY): Payer: Self-pay | Admitting: *Deleted

## 2015-12-03 ENCOUNTER — Ambulatory Visit (HOSPITAL_COMMUNITY)
Admission: RE | Admit: 2015-12-03 | Discharge: 2015-12-03 | Disposition: A | Discharge status: Home / Self Care | Payer: Medicare Other | Source: Ambulatory Visit | Attending: Endocrinology | Admitting: Endocrinology

## 2015-12-03 DIAGNOSIS — M810 Age-related osteoporosis without current pathological fracture: Secondary | ICD-10-CM | POA: Insufficient documentation

## 2015-12-03 MED ORDER — ZOLEDRONIC ACID 5 MG/100ML IV SOLN
INTRAVENOUS | Status: AC
  Administered 2015-12-03: 5 mg
  Filled 2015-12-03: qty 100

## 2015-12-03 MED ORDER — ZOLEDRONIC ACID 5 MG/100ML IV SOLN
5.0000 mg | Freq: Once | INTRAVENOUS | Status: DC
  Filled 2015-12-03: qty 100

## 2016-01-13 ENCOUNTER — Ambulatory Visit (INDEPENDENT_AMBULATORY_CARE_PROVIDER_SITE_OTHER): Payer: Medicare Other | Admitting: Internal Medicine

## 2016-01-13 VITALS — BP 134/78 | HR 88

## 2016-01-13 DIAGNOSIS — Z23 Encounter for immunization: Secondary | ICD-10-CM | POA: Diagnosis not present

## 2016-01-24 ENCOUNTER — Other Ambulatory Visit: Payer: Self-pay | Admitting: Internal Medicine

## 2016-01-25 DIAGNOSIS — H25813 Combined forms of age-related cataract, bilateral: Secondary | ICD-10-CM | POA: Diagnosis not present

## 2016-01-25 DIAGNOSIS — H33302 Unspecified retinal break, left eye: Secondary | ICD-10-CM | POA: Diagnosis not present

## 2016-01-25 DIAGNOSIS — H524 Presbyopia: Secondary | ICD-10-CM | POA: Diagnosis not present

## 2016-01-25 DIAGNOSIS — H35372 Puckering of macula, left eye: Secondary | ICD-10-CM | POA: Diagnosis not present

## 2016-01-25 DIAGNOSIS — H52223 Regular astigmatism, bilateral: Secondary | ICD-10-CM | POA: Diagnosis not present

## 2016-01-25 DIAGNOSIS — H5203 Hypermetropia, bilateral: Secondary | ICD-10-CM | POA: Diagnosis not present

## 2016-02-05 ENCOUNTER — Other Ambulatory Visit: Payer: Self-pay | Admitting: Internal Medicine

## 2016-02-05 NOTE — Telephone Encounter (Signed)
Refill x 6 months 

## 2016-02-08 NOTE — Telephone Encounter (Signed)
Phoned to pharmacy 

## 2016-02-15 ENCOUNTER — Ambulatory Visit (INDEPENDENT_AMBULATORY_CARE_PROVIDER_SITE_OTHER): Payer: Medicare Other | Admitting: Ophthalmology

## 2016-02-15 DIAGNOSIS — H2513 Age-related nuclear cataract, bilateral: Secondary | ICD-10-CM | POA: Diagnosis not present

## 2016-02-15 DIAGNOSIS — H33302 Unspecified retinal break, left eye: Secondary | ICD-10-CM

## 2016-02-15 DIAGNOSIS — H35372 Puckering of macula, left eye: Secondary | ICD-10-CM | POA: Diagnosis not present

## 2016-02-15 DIAGNOSIS — D3131 Benign neoplasm of right choroid: Secondary | ICD-10-CM | POA: Diagnosis not present

## 2016-02-15 DIAGNOSIS — H43813 Vitreous degeneration, bilateral: Secondary | ICD-10-CM | POA: Diagnosis not present

## 2016-02-15 DIAGNOSIS — H353121 Nonexudative age-related macular degeneration, left eye, early dry stage: Secondary | ICD-10-CM | POA: Diagnosis not present

## 2016-04-14 ENCOUNTER — Other Ambulatory Visit: Payer: Self-pay | Admitting: Internal Medicine

## 2016-04-29 ENCOUNTER — Other Ambulatory Visit: Payer: Medicare Other | Admitting: Internal Medicine

## 2016-04-29 DIAGNOSIS — E039 Hypothyroidism, unspecified: Secondary | ICD-10-CM | POA: Diagnosis not present

## 2016-04-29 LAB — TSH: TSH: 5.98 mIU/L — ABNORMAL HIGH

## 2016-05-02 ENCOUNTER — Ambulatory Visit (INDEPENDENT_AMBULATORY_CARE_PROVIDER_SITE_OTHER): Payer: Medicare Other | Admitting: Internal Medicine

## 2016-05-02 ENCOUNTER — Encounter: Payer: Self-pay | Admitting: Internal Medicine

## 2016-05-02 VITALS — BP 116/70 | HR 62 | Temp 98.1°F | Resp 18 | Wt 124.5 lb

## 2016-05-02 DIAGNOSIS — E039 Hypothyroidism, unspecified: Secondary | ICD-10-CM | POA: Diagnosis not present

## 2016-05-02 LAB — TSH: TSH: 3.84 m[IU]/L

## 2016-05-02 NOTE — Progress Notes (Signed)
   Subjective:    Patient ID: Maria Perkins, female    DOB: 07-24-1951, 65 y.o.   MRN: [id number]  HPI Six-month follow-up on hypothyroidism. TSH elevated at 5.98 which is unusual for her. At last visit 6 months ago TSH was within normal limits on same dose of thyroid replacement medication. Has not been taking it with other medications or food. Takes it at night before she goes to bed. Has been drinking some tart cherry juice for musculoskeletal pain which she started around the time that she went on a trip to [place]. She will call pharmacist and see if there is an interaction with that. I do not see an interaction in Up-to-date. Otherwise she feels well.    Review of Systems     Objective:   Physical Exam  No thyromegaly      Assessment & Plan:  Hypothyroidism  Plan: Since we are puzzled by her abnormal TSH. Were going to repeated today to see if it is perhaps a mistake. If not, we will need to adjust her dose of thyroid replacement medication. She will call pharmacist in the meantime

## 2016-05-02 NOTE — Patient Instructions (Signed)
TSH repeated today with further instructions to follow.

## 2016-05-04 ENCOUNTER — Telehealth: Payer: Self-pay | Admitting: Internal Medicine

## 2016-05-04 NOTE — Telephone Encounter (Signed)
The concern about lead exposure  was not mentioned at her visit. I doubt this is the issue with her thyroid. Porphyria, which I think she was trying to say, is rare-- never seen a case in 30 years. There is a screening test for lead poisoning. I will research and she can come have that test done . I am not changing thyroid dose right now. She is welcome to seek Endocrinology referral if not satisfied.

## 2016-05-04 NOTE — Telephone Encounter (Signed)
Spoke with patient and set her up for labs on Thursday, 05/05/16 for blood lead level and Free Erythrocyte protoporphyrin (FEP).  Dx:  Exposure to lead.  Patient is to also provide [place] with answer from her pharmacist as to how well cherry juice is absorbed.  She has been drinking a lot of cherry juice as of recent.

## 2016-05-04 NOTE — Telephone Encounter (Signed)
Patient saw her TSH levels on My Chart and is calling because she has concerns regarding her TSH levels.  Advised of your message that you have no plans to change her medication at this time.  Leaving her medication at the same dosage and re-check TSH in 3 months.  Patient has a concern that she was exposed to potential "Lead Exposure" when she was helping her daughter move several months ago.  She has been doing "some research" in the past couple of days on thyroid and what can cause your thyroid numbers to go up and down.  She found a term called "porferia" which can be caused by exposure to lead poisoning.  Since she has been exposed to lead when helping her daughter move, she feels that this has to be considered.  I reassured that patient that her numbers were within the normal range and you could not explain the variance but we would re-check her TSH in 3 months.    Patient is not happy with that answer.  Since she has been "exposed", patient feels that she should at the very least have some sort of blood test for lead and/or iron to see if she has any type of lead poisoning and if this could some how be affecting her thyroid levels.  I tried to nicely tell her that she needs to NOT read too much into [employer].  She didn't take that too well.  She is pretty determined that she needs additional labs and follow up from you on this topic.    Please advise.

## 2016-05-05 ENCOUNTER — Other Ambulatory Visit: Payer: Medicare Other | Admitting: Internal Medicine

## 2016-05-05 DIAGNOSIS — Z77011 Contact with and (suspected) exposure to lead: Secondary | ICD-10-CM

## 2016-05-08 LAB — LEAD, BLOOD (ADULT >= 16 YRS): Lead-Whole Blood: 1 ug/dL (ref <5)

## 2016-05-09 LAB — ERYTHROCYTE PORPHYRIN: Erythrocyte Porphyrin, WB: 37 ug/dL — ABNORMAL HIGH (ref 0–35)

## 2016-07-18 ENCOUNTER — Other Ambulatory Visit: Payer: Medicare Other | Admitting: Internal Medicine

## 2016-07-18 DIAGNOSIS — E039 Hypothyroidism, unspecified: Secondary | ICD-10-CM

## 2016-07-18 DIAGNOSIS — Z0000 Encounter for general adult medical examination without abnormal findings: Secondary | ICD-10-CM

## 2016-07-18 LAB — TSH: TSH: 3.67 m[IU]/L

## 2016-07-19 ENCOUNTER — Telehealth: Payer: Self-pay

## 2016-07-19 NOTE — Telephone Encounter (Signed)
Called patient. Gave results and instructions. Patient verbalized understanding. Patient says she is still concerned about the major increase in her TSH from previous results. She would like to speak w/ Dr. Renold Genta about factors. Advised annual exam in December.  Patient is aware.

## 2016-07-19 NOTE — Telephone Encounter (Signed)
Called patient. Gave recommendation per Dr. Renold Genta. Patient verbalized understanding.  Patient says she will contact endocrinologist. (Dr. Elyse Hsu) herself. Advised patient happy to send referral. She says she will contact the office if she needs paperwork sent.

## 2016-07-19 NOTE — Telephone Encounter (Signed)
-----   Message from Elby Showers, MD sent at 07/18/2016  8:54 PM EDT ----- Please call pt. TSH is in the 3 range which is almost the same as last time we checked it. Probably would leave Levothyroxine dose alone if feeling OK

## 2016-07-19 NOTE — Telephone Encounter (Signed)
Please call pt and ask that she see Endocrinologist to help answer her questions. We will be happy to make referral for her.

## 2016-07-20 ENCOUNTER — Other Ambulatory Visit: Payer: Self-pay | Admitting: Internal Medicine

## 2016-07-20 NOTE — Telephone Encounter (Signed)
Patient called back to advise that she has an appointment with Dr. Elyse Hsu on Tuesday, 9/5.  Patient wanted to be sure that we would send lab results to his office.  Advised patient that I would send office notes and previous labs to Dr. Altheimer's office for his review.    Faxed documentation to his office.

## 2016-07-26 DIAGNOSIS — M810 Age-related osteoporosis without current pathological fracture: Secondary | ICD-10-CM | POA: Diagnosis not present

## 2016-07-26 DIAGNOSIS — E038 Other specified hypothyroidism: Secondary | ICD-10-CM | POA: Diagnosis not present

## 2016-08-04 ENCOUNTER — Other Ambulatory Visit: Payer: Medicare Other | Admitting: Internal Medicine

## 2016-08-18 ENCOUNTER — Ambulatory Visit (INDEPENDENT_AMBULATORY_CARE_PROVIDER_SITE_OTHER): Payer: Medicare Other | Admitting: Ophthalmology

## 2016-08-18 DIAGNOSIS — H353121 Nonexudative age-related macular degeneration, left eye, early dry stage: Secondary | ICD-10-CM

## 2016-08-18 DIAGNOSIS — D3131 Benign neoplasm of right choroid: Secondary | ICD-10-CM

## 2016-08-18 DIAGNOSIS — H43813 Vitreous degeneration, bilateral: Secondary | ICD-10-CM | POA: Diagnosis not present

## 2016-08-18 DIAGNOSIS — H33302 Unspecified retinal break, left eye: Secondary | ICD-10-CM

## 2016-08-18 DIAGNOSIS — H35372 Puckering of macula, left eye: Secondary | ICD-10-CM | POA: Diagnosis not present

## 2016-10-28 ENCOUNTER — Other Ambulatory Visit: Payer: Medicare Other | Admitting: Internal Medicine

## 2016-10-28 ENCOUNTER — Ambulatory Visit (INDEPENDENT_AMBULATORY_CARE_PROVIDER_SITE_OTHER): Payer: Medicare Other | Admitting: Internal Medicine

## 2016-10-28 DIAGNOSIS — Z23 Encounter for immunization: Secondary | ICD-10-CM | POA: Diagnosis not present

## 2016-11-03 ENCOUNTER — Encounter: Payer: Medicare Other | Admitting: Internal Medicine

## 2016-11-16 NOTE — Progress Notes (Signed)
Flu vaccine given.

## 2016-11-25 ENCOUNTER — Other Ambulatory Visit: Payer: Medicare Other | Admitting: Internal Medicine

## 2016-11-25 DIAGNOSIS — Z0000 Encounter for general adult medical examination without abnormal findings: Secondary | ICD-10-CM | POA: Diagnosis not present

## 2016-11-25 DIAGNOSIS — E039 Hypothyroidism, unspecified: Secondary | ICD-10-CM

## 2016-11-25 LAB — CBC WITH DIFFERENTIAL/PLATELET
BASOS PCT: 1 %
Basophils Absolute: 42 cells/uL (ref 0–200)
EOS ABS: 84 {cells}/uL (ref 15–500)
Eosinophils Relative: 2 %
HEMATOCRIT: 42.1 % (ref 35.0–45.0)
HEMOGLOBIN: 13.8 g/dL (ref 11.7–15.5)
LYMPHS ABS: 1176 {cells}/uL (ref 850–3900)
Lymphocytes Relative: 28 %
MCH: 30.5 pg (ref 27.0–33.0)
MCHC: 32.8 g/dL (ref 32.0–36.0)
MCV: 92.9 fL (ref 80.0–100.0)
MONO ABS: 378 {cells}/uL (ref 200–950)
MPV: 9.7 fL (ref 7.5–12.5)
Monocytes Relative: 9 %
Neutro Abs: 2520 cells/uL (ref 1500–7800)
Neutrophils Relative %: 60 %
Platelets: 200 10*3/uL (ref 140–400)
RBC: 4.53 MIL/uL (ref 3.80–5.10)
RDW: 13.6 % (ref 11.0–15.0)
WBC: 4.2 10*3/uL (ref 3.8–10.8)

## 2016-11-25 LAB — COMPREHENSIVE METABOLIC PANEL
ALBUMIN: 4.4 g/dL (ref 3.6–5.1)
ALK PHOS: 45 U/L (ref 33–130)
ALT: 14 U/L (ref 6–29)
AST: 24 U/L (ref 10–35)
BILIRUBIN TOTAL: 1.4 mg/dL — AB (ref 0.2–1.2)
BUN: 15 mg/dL (ref 7–25)
CO2: 27 mmol/L (ref 20–31)
Calcium: 9.9 mg/dL (ref 8.6–10.4)
Chloride: 102 mmol/L (ref 98–110)
Creat: 0.68 mg/dL (ref 0.50–0.99)
GLUCOSE: 94 mg/dL (ref 65–99)
POTASSIUM: 5 mmol/L (ref 3.5–5.3)
Sodium: 138 mmol/L (ref 135–146)
TOTAL PROTEIN: 6.7 g/dL (ref 6.1–8.1)

## 2016-11-25 LAB — LIPID PANEL
CHOLESTEROL: 199 mg/dL (ref <200)
HDL: 91 mg/dL (ref >50)
LDL CALC: 94 mg/dL (ref <100)
Total CHOL/HDL Ratio: 2.2 Ratio (ref <5.0)
Triglycerides: 72 mg/dL (ref <150)
VLDL: 14 mg/dL (ref <30)

## 2016-11-25 LAB — T4, FREE: Free T4: 1.6 ng/dL (ref 0.8–1.8)

## 2016-11-25 LAB — TSH: TSH: 0.42 m[IU]/L

## 2016-11-28 ENCOUNTER — Ambulatory Visit (INDEPENDENT_AMBULATORY_CARE_PROVIDER_SITE_OTHER): Payer: Medicare Other | Admitting: Internal Medicine

## 2016-11-28 ENCOUNTER — Other Ambulatory Visit: Payer: Self-pay | Admitting: Internal Medicine

## 2016-11-28 ENCOUNTER — Encounter: Payer: Self-pay | Admitting: Internal Medicine

## 2016-11-28 VITALS — BP 96/62 | HR 60 | Temp 97.8°F | Ht 64.0 in | Wt 127.0 lb

## 2016-11-28 DIAGNOSIS — Z1231 Encounter for screening mammogram for malignant neoplasm of breast: Secondary | ICD-10-CM

## 2016-11-28 DIAGNOSIS — E039 Hypothyroidism, unspecified: Secondary | ICD-10-CM

## 2016-11-28 DIAGNOSIS — F5101 Primary insomnia: Secondary | ICD-10-CM

## 2016-11-28 DIAGNOSIS — Z0000 Encounter for general adult medical examination without abnormal findings: Secondary | ICD-10-CM | POA: Diagnosis not present

## 2016-11-28 DIAGNOSIS — M810 Age-related osteoporosis without current pathological fracture: Secondary | ICD-10-CM

## 2016-11-28 LAB — POCT URINALYSIS DIPSTICK
BILIRUBIN UA: NEGATIVE
Glucose, UA: NEGATIVE
KETONES UA: NEGATIVE
LEUKOCYTES UA: NEGATIVE
Nitrite, UA: NEGATIVE
Protein, UA: NEGATIVE
RBC UA: NEGATIVE
Spec Grav, UA: <=1.005
Urobilinogen, UA: NEGATIVE
pH, UA: 6

## 2016-11-28 NOTE — Progress Notes (Signed)
Subjective:    Patient ID: Maria Perkins, female    DOB: 1950-12-22, 66 y.o.   MRN: [id number]  HPI  66 year old Female in today for Medicare Wellness exam and evaluation of medical issues.  Former smoker smoking 1 pack per day but quit in May 1973.  History of vaginal dryness.  History of hemorrhoids treated with Anusol or Proctofoam.  History of hypothyroidism, osteoporosis, and insomnia.  Sees Dr. Elyse Hsu regarding osteoporosis and management of hypothyroidism.  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 She improved and never required surgery. She took Fosamax weekly for osteopenia but this was discontinued in 2006 after being on it for a number of years. Most recent bone density study showed osteoporosis. Various therapies have been discussed with her.  Social history: She is married. 2 adult daughters. Husband is former English as a second language teacher of the [employer]. He now works at [employer]. 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 Patient quit smoking in 1973. Social alcohol consumption consisting of beer and wine. She is a Writer of [employer].  Family history: 2 brothers in good health. No sisters. Mother died at age 60 from complications of a stroke that occurred 04-Dec-1999. Father died with history of diabetes and dementia.        Review of Systems  Constitutional: Negative.   All other systems reviewed and are negative.  no new complaints     Objective:   Physical Exam  Constitutional: She is oriented to person, place, and time. She appears well-developed and well-nourished. No distress.  HENT:  Head: Normocephalic and atraumatic.  Right Ear: External ear normal.  Left Ear: External ear normal.  Mouth/Throat: Oropharynx is clear and moist. No oropharyngeal exudate.  Eyes:  Conjunctivae and EOM are normal. Pupils are equal, round, and reactive to light. Right eye exhibits no discharge. Left eye exhibits no discharge. No scleral icterus.  Neck: Neck supple. No JVD present. No tracheal deviation present. No thyromegaly present.  Cardiovascular: Normal rate, regular rhythm, normal heart sounds and intact distal pulses.   No murmur heard. Pulmonary/Chest: Effort normal. She has no wheezes. She has no rales.  Abdominal: Soft. Bowel sounds are normal. She exhibits no distension and no mass. There is no tenderness. There is no rebound and no guarding.  Genitourinary:  Genitourinary Comments: Bimanual normal. Pap taken 2016. Pap not to be repeated due to age  Musculoskeletal: She exhibits no edema.  Lymphadenopathy:    She has no cervical adenopathy.  Neurological: She is alert and oriented to person, place, and time. She has normal reflexes. No cranial nerve deficit. Coordination normal.  Skin: Skin is warm and dry. No rash noted. She is not diaphoretic.  Psychiatric: She has a normal mood and affect. Her behavior is normal. Thought content normal.  Vitals reviewed.         Assessment & Plan:  Hypothyroidism  Osteoporosis  Insomnia  History of external hemorrhoids  Very mild elevation of total bilirubin which I think is insignificant  Plan: Follow treatment recommendations recommended by Dr. Eden Emms, endocrinologist for hypothyroidism and osteoporosis. Needs Prevnar 23 which will be given in a later date. Colonoscopy up-to-date. Had flu vaccine in 12/04/2023. Return in 6 months for six-month recheck July 2018.  Subjective:   Patient presents for Medicare Annual/Subsequent preventive examination.  Review Past Medical/Family/Social:See above  Risk Factors  Current exercise habits: Stays very active about her home with home improvement activities Dietary issues discussed: Low-fat low carbohydrate  Cardiac risk factors:Family history of stroke in  mother  Depression Screen  (Note: if answer to either of the following is "Yes", a more complete depression screening is indicated)   Over the past two weeks, have you felt down, depressed or hopeless? No  Over the past two weeks, have you felt little interest or pleasure in doing things? No Have you lost interest or pleasure in daily life? No Do you often feel hopeless? No Do you cry easily over simple problems? No   Activities of Daily Living  In your present state of health, do you have any difficulty performing the following activities?:   Driving? No  Managing money? No  Feeding yourself? No  Getting from bed to chair? No  Climbing a flight of stairs? No  Preparing food and eating?: No  Bathing or showering? No  Getting dressed: No  Getting to the toilet? No  Using the toilet:No  Moving around from place to place: No  In the past year have you fallen or had a near fall?:No  Are you sexually active? No  Do you have more than one partner? No   Hearing Difficulties: No  Do you often ask people to speak up or repeat themselves? No  Do you experience ringing or noises in your ears? No  Do you have difficulty understanding soft or whispered voices? No  Do you feel that you have a problem with memory? No Do you often misplace items? No    Home Safety:  Do you have a smoke alarm at your residence? Yes Do you have grab bars in the bathroom?Yes Do you have throw rugs in your house? Yes   Cognitive Testing  Alert? Yes Normal Appearance?Yes  Oriented to person? Yes Place? Yes  Time? Yes  Recall of three objects? Yes  Can perform simple calculations? Yes  Displays appropriate judgment?Yes  Can read the correct time from a watch face?Yes   List the Names of Other Physician/Practitioners you currently use:  See referral list for the physicians patient is currently seeing.   Endocrinologist  Review of Systems: See above   Objective:     General appearance: Appears  stated age and thin Head: Normocephalic, without obvious abnormality, atraumatic  Eyes: conj clear, EOMi PEERLA  Ears: normal TM's and external ear canals both ears  Nose: Nares normal. Septum midline. Mucosa normal. No drainage or sinus tenderness.  Throat: lips, mucosa, and tongue normal; teeth and gums normal  Neck: no adenopathy, no carotid bruit, no JVD, supple, symmetrical, trachea midline and thyroid not enlarged, symmetric, no tenderness/mass/nodules  No CVA tenderness.  Lungs: clear to auscultation bilaterally  Breasts: normal appearance, no masses or tenderness Heart: regular rate and rhythm, S1, S2 normal, no murmur, click, rub or gallop  Abdomen: soft, non-tender; bowel sounds normal; no masses, no organomegaly  Musculoskeletal: ROM normal in all joints, no crepitus, no deformity, Normal muscle strengthen. Back  is symmetric, no curvature. Skin: Skin color, texture, turgor normal. No rashes or lesions  Lymph nodes: Cervical, supraclavicular, and axillary nodes normal.  Neurologic: CN 2 -12 Normal, Normal symmetric reflexes. Normal coordination and gait  Psych: Alert & Oriented x 3, Mood appear stable.    Assessment:    Annual wellness medicare exam   Plan:    During the course of the visit the patient was educated and counseled  about appropriate screening and preventive services including:   Annual mammogram  Annual flu vaccine  Pneumococcal 23 vaccine to be given in the near future  Zostavax given 2011     Patient Instructions (the written plan) was given to the patient.  Medicare Attestation  I have personally reviewed:  The patient's medical and social history  Their use of alcohol, tobacco or illicit drugs  Their current medications and supplements  The patient's functional ability including ADLs,fall risks, home safety risks, cognitive, and hearing and visual impairment  Diet and physical activities  Evidence for depression or mood disorders  The  patient's weight, height, BMI, and visual acuity have been recorded in the chart. I have made referrals, counseling, and provided education to the patient based on review of the above and I have provided the patient with a written personalized care plan for preventive services.

## 2016-12-02 ENCOUNTER — Ambulatory Visit
Admission: RE | Admit: 2016-12-02 | Discharge: 2016-12-02 | Disposition: A | Discharge status: Home / Self Care | Payer: Medicare Other | Source: Ambulatory Visit | Attending: Internal Medicine | Admitting: Internal Medicine

## 2016-12-02 DIAGNOSIS — Z1231 Encounter for screening mammogram for malignant neoplasm of breast: Secondary | ICD-10-CM

## 2016-12-06 DIAGNOSIS — E039 Hypothyroidism, unspecified: Secondary | ICD-10-CM | POA: Diagnosis not present

## 2016-12-06 DIAGNOSIS — M810 Age-related osteoporosis without current pathological fracture: Secondary | ICD-10-CM | POA: Diagnosis not present

## 2016-12-08 DIAGNOSIS — M810 Age-related osteoporosis without current pathological fracture: Secondary | ICD-10-CM | POA: Insufficient documentation

## 2016-12-18 NOTE — Patient Instructions (Signed)
Was a pleasure to see you today. Pneumococcal 23 vaccine to be given in February. Return in 6 months. Continue to see endocrinologist.

## 2016-12-21 ENCOUNTER — Other Ambulatory Visit (HOSPITAL_COMMUNITY): Payer: Self-pay | Admitting: *Deleted

## 2016-12-22 ENCOUNTER — Ambulatory Visit (HOSPITAL_COMMUNITY)
Admission: RE | Admit: 2016-12-22 | Discharge: 2016-12-22 | Disposition: A | Discharge status: Home / Self Care | Payer: Medicare Other | Source: Ambulatory Visit | Attending: Endocrinology | Admitting: Endocrinology

## 2016-12-22 DIAGNOSIS — M810 Age-related osteoporosis without current pathological fracture: Secondary | ICD-10-CM | POA: Insufficient documentation

## 2016-12-22 MED ORDER — ZOLEDRONIC ACID 5 MG/100ML IV SOLN
5.0000 mg | Freq: Once | INTRAVENOUS | Status: AC
  Administered 2016-12-22: 5 mg via INTRAVENOUS

## 2016-12-22 MED ORDER — ZOLEDRONIC ACID 5 MG/100ML IV SOLN
INTRAVENOUS | Status: AC
  Filled 2016-12-22: qty 100

## 2016-12-22 NOTE — Progress Notes (Signed)
Pt was a no call no show for her appointment today

## 2017-01-10 ENCOUNTER — Other Ambulatory Visit: Payer: Self-pay | Admitting: Internal Medicine

## 2017-01-10 NOTE — Telephone Encounter (Signed)
Refill x 6 months 

## 2017-01-16 ENCOUNTER — Ambulatory Visit: Payer: Medicare Other | Admitting: Internal Medicine

## 2017-01-20 DIAGNOSIS — H2513 Age-related nuclear cataract, bilateral: Secondary | ICD-10-CM | POA: Diagnosis not present

## 2017-01-20 DIAGNOSIS — H1789 Other corneal scars and opacities: Secondary | ICD-10-CM | POA: Diagnosis not present

## 2017-01-20 DIAGNOSIS — H52223 Regular astigmatism, bilateral: Secondary | ICD-10-CM | POA: Diagnosis not present

## 2017-01-20 DIAGNOSIS — D3131 Benign neoplasm of right choroid: Secondary | ICD-10-CM | POA: Diagnosis not present

## 2017-01-20 DIAGNOSIS — H35372 Puckering of macula, left eye: Secondary | ICD-10-CM | POA: Diagnosis not present

## 2017-01-20 DIAGNOSIS — H5203 Hypermetropia, bilateral: Secondary | ICD-10-CM | POA: Diagnosis not present

## 2017-05-04 ENCOUNTER — Ambulatory Visit (INDEPENDENT_AMBULATORY_CARE_PROVIDER_SITE_OTHER): Payer: Medicare Other | Admitting: Ophthalmology

## 2017-05-04 DIAGNOSIS — D3131 Benign neoplasm of right choroid: Secondary | ICD-10-CM

## 2017-05-04 DIAGNOSIS — H43813 Vitreous degeneration, bilateral: Secondary | ICD-10-CM

## 2017-05-04 DIAGNOSIS — H35372 Puckering of macula, left eye: Secondary | ICD-10-CM

## 2017-05-04 DIAGNOSIS — H353122 Nonexudative age-related macular degeneration, left eye, intermediate dry stage: Secondary | ICD-10-CM

## 2017-05-04 DIAGNOSIS — H33302 Unspecified retinal break, left eye: Secondary | ICD-10-CM | POA: Diagnosis not present

## 2017-05-18 ENCOUNTER — Ambulatory Visit (INDEPENDENT_AMBULATORY_CARE_PROVIDER_SITE_OTHER): Payer: Medicare Other | Admitting: Ophthalmology

## 2017-05-29 ENCOUNTER — Other Ambulatory Visit: Payer: Medicare Other | Admitting: Internal Medicine

## 2017-05-29 DIAGNOSIS — M810 Age-related osteoporosis without current pathological fracture: Secondary | ICD-10-CM | POA: Diagnosis not present

## 2017-05-29 DIAGNOSIS — R17 Unspecified jaundice: Secondary | ICD-10-CM

## 2017-05-29 DIAGNOSIS — E039 Hypothyroidism, unspecified: Secondary | ICD-10-CM

## 2017-05-30 LAB — HEPATIC FUNCTION PANEL
ALBUMIN: 4.6 g/dL (ref 3.6–5.1)
ALK PHOS: 55 U/L (ref 33–130)
ALT: 14 U/L (ref 6–29)
AST: 24 U/L (ref 10–35)
BILIRUBIN DIRECT: 0.3 mg/dL — AB (ref <=0.2)
BILIRUBIN INDIRECT: 1 mg/dL (ref 0.2–1.2)
Total Bilirubin: 1.3 mg/dL — ABNORMAL HIGH (ref 0.2–1.2)
Total Protein: 7 g/dL (ref 6.1–8.1)

## 2017-05-30 LAB — TSH: TSH: 1.1 m[IU]/L

## 2017-05-30 LAB — LIPID PANEL
CHOL/HDL RATIO: 2.2 ratio (ref <5.0)
Cholesterol: 195 mg/dL (ref <200)
HDL: 89 mg/dL (ref >50)
LDL Cholesterol: 92 mg/dL (ref <100)
Triglycerides: 71 mg/dL (ref <150)
VLDL: 14 mg/dL (ref <30)

## 2017-05-30 LAB — T4, FREE: FREE T4: 1.3 ng/dL (ref 0.8–1.8)

## 2017-06-01 ENCOUNTER — Ambulatory Visit (INDEPENDENT_AMBULATORY_CARE_PROVIDER_SITE_OTHER): Payer: Medicare Other | Admitting: Internal Medicine

## 2017-06-01 ENCOUNTER — Encounter: Payer: Self-pay | Admitting: Internal Medicine

## 2017-06-01 VITALS — BP 102/64 | HR 65 | Temp 97.7°F | Wt 125.0 lb

## 2017-06-01 DIAGNOSIS — L821 Other seborrheic keratosis: Secondary | ICD-10-CM | POA: Diagnosis not present

## 2017-06-01 DIAGNOSIS — L814 Other melanin hyperpigmentation: Secondary | ICD-10-CM | POA: Diagnosis not present

## 2017-06-01 DIAGNOSIS — E039 Hypothyroidism, unspecified: Secondary | ICD-10-CM

## 2017-06-01 DIAGNOSIS — M810 Age-related osteoporosis without current pathological fracture: Secondary | ICD-10-CM

## 2017-06-01 DIAGNOSIS — D1801 Hemangioma of skin and subcutaneous tissue: Secondary | ICD-10-CM | POA: Diagnosis not present

## 2017-06-01 DIAGNOSIS — D239 Other benign neoplasm of skin, unspecified: Secondary | ICD-10-CM | POA: Diagnosis not present

## 2017-06-01 NOTE — Patient Instructions (Signed)
It was pleasure to see today. Continue same dose of thyroid replacement and return for physical exam in 6 months.

## 2017-06-01 NOTE — Progress Notes (Signed)
   Subjective:    Patient ID: Maria Perkins, female    DOB: 03-Nov-1951, 66 y.o.   MRN: [id number]  HPI 66 year old Female in today follow-up on hypothyroidism and osteoporosis. She will be seeing Dr. Elyse Hsu next week regarding osteoporosis. She has finished 3 Reclast infusions and is to have a bone density study 6 months from her last infusion. She feels well. His been spending a lot of time at the beach. No new complaints.  She has a history of a very slightly elevated bilirubin. Has had negative ultrasound. Total bilirubin is 1.3 normal being up to 1.2 in 6 months ago was 1.4. Direct bilirubin is 0.3.  TSH is normal at 1.10.  Has some fatigue but thinks it is related to age.      Review of Systems see above     Objective:   Physical Exam No thyromegaly. Chest clear to auscultation. Cardiac exam regular rate and rhythm normal S1 and S2. Extremities without edema. Abdomen no hepatosplenomegaly masses or tenderness       Assessment & Plan:  Hypothyroidism-stable on Synthroid 0.2 mg daily. Follow-up with Dr. Elyse Hsu next week regarding hypothyroidism and osteoporosis. We will plan to do physical exam in January 2019.  Osteoporosis-see above- bone density study planned in the near future. Status post treatment with Reclast.  Mild elevation of bilirubin-stable  Plan: Return in 6 months for physical exam

## 2017-06-05 DIAGNOSIS — E039 Hypothyroidism, unspecified: Secondary | ICD-10-CM | POA: Diagnosis not present

## 2017-06-05 DIAGNOSIS — Z833 Family history of diabetes mellitus: Secondary | ICD-10-CM | POA: Diagnosis not present

## 2017-06-05 DIAGNOSIS — M810 Age-related osteoporosis without current pathological fracture: Secondary | ICD-10-CM | POA: Diagnosis not present

## 2017-06-05 DIAGNOSIS — Z8349 Family history of other endocrine, nutritional and metabolic diseases: Secondary | ICD-10-CM | POA: Diagnosis not present

## 2017-06-09 ENCOUNTER — Other Ambulatory Visit: Payer: Self-pay

## 2017-10-19 DIAGNOSIS — Z23 Encounter for immunization: Secondary | ICD-10-CM | POA: Diagnosis not present

## 2017-10-26 ENCOUNTER — Other Ambulatory Visit: Payer: Self-pay | Admitting: Internal Medicine

## 2017-10-26 DIAGNOSIS — E559 Vitamin D deficiency, unspecified: Secondary | ICD-10-CM

## 2017-10-26 DIAGNOSIS — E2839 Other primary ovarian failure: Secondary | ICD-10-CM

## 2017-10-26 DIAGNOSIS — Z1239 Encounter for other screening for malignant neoplasm of breast: Secondary | ICD-10-CM

## 2017-10-26 DIAGNOSIS — M810 Age-related osteoporosis without current pathological fracture: Secondary | ICD-10-CM

## 2017-10-26 DIAGNOSIS — Z1231 Encounter for screening mammogram for malignant neoplasm of breast: Secondary | ICD-10-CM

## 2017-11-15 ENCOUNTER — Other Ambulatory Visit: Payer: Self-pay | Admitting: Internal Medicine

## 2017-11-15 DIAGNOSIS — Z13220 Encounter for screening for lipoid disorders: Secondary | ICD-10-CM

## 2017-11-15 DIAGNOSIS — E039 Hypothyroidism, unspecified: Secondary | ICD-10-CM

## 2017-11-15 DIAGNOSIS — Z0000 Encounter for general adult medical examination without abnormal findings: Secondary | ICD-10-CM

## 2017-11-15 DIAGNOSIS — M810 Age-related osteoporosis without current pathological fracture: Secondary | ICD-10-CM

## 2017-11-15 DIAGNOSIS — Z1321 Encounter for screening for nutritional disorder: Secondary | ICD-10-CM

## 2017-11-27 ENCOUNTER — Other Ambulatory Visit: Payer: Medicare Other | Admitting: Internal Medicine

## 2017-11-27 DIAGNOSIS — M810 Age-related osteoporosis without current pathological fracture: Secondary | ICD-10-CM

## 2017-11-27 DIAGNOSIS — E039 Hypothyroidism, unspecified: Secondary | ICD-10-CM | POA: Diagnosis not present

## 2017-11-27 DIAGNOSIS — Z0000 Encounter for general adult medical examination without abnormal findings: Secondary | ICD-10-CM | POA: Diagnosis not present

## 2017-11-27 DIAGNOSIS — Z1321 Encounter for screening for nutritional disorder: Secondary | ICD-10-CM

## 2017-11-27 DIAGNOSIS — Z13220 Encounter for screening for lipoid disorders: Secondary | ICD-10-CM | POA: Diagnosis not present

## 2017-11-28 LAB — CBC WITH DIFFERENTIAL/PLATELET
BASOS PCT: 1 %
Basophils Absolute: 42 cells/uL (ref 0–200)
EOS PCT: 3.1 %
Eosinophils Absolute: 130 cells/uL (ref 15–500)
HCT: 41.3 % (ref 35.0–45.0)
Hemoglobin: 14.3 g/dL (ref 11.7–15.5)
Lymphs Abs: 1424 cells/uL (ref 850–3900)
MCH: 31.2 pg (ref 27.0–33.0)
MCHC: 34.6 g/dL (ref 32.0–36.0)
MCV: 90.2 fL (ref 80.0–100.0)
MPV: 10.2 fL (ref 7.5–12.5)
Monocytes Relative: 9.6 %
Neutro Abs: 2201 cells/uL (ref 1500–7800)
Neutrophils Relative %: 52.4 %
PLATELETS: 207 10*3/uL (ref 140–400)
RBC: 4.58 10*6/uL (ref 3.80–5.10)
RDW: 11.9 % (ref 11.0–15.0)
TOTAL LYMPHOCYTE: 33.9 %
WBC: 4.2 10*3/uL (ref 3.8–10.8)
WBCMIX: 403 {cells}/uL (ref 200–950)

## 2017-11-28 LAB — COMPLETE METABOLIC PANEL WITH GFR
AG Ratio: 2 (calc) (ref 1.0–2.5)
ALKALINE PHOSPHATASE (APISO): 55 U/L (ref 33–130)
ALT: 13 U/L (ref 6–29)
AST: 21 U/L (ref 10–35)
Albumin: 4.5 g/dL (ref 3.6–5.1)
BUN: 17 mg/dL (ref 7–25)
CO2: 30 mmol/L (ref 20–32)
CREATININE: 0.74 mg/dL (ref 0.50–0.99)
Calcium: 10.1 mg/dL (ref 8.6–10.4)
Chloride: 103 mmol/L (ref 98–110)
GFR, Est African American: 97 mL/min/{1.73_m2} (ref >=60)
GFR, Est Non African American: 84 mL/min/{1.73_m2} (ref >=60)
GLUCOSE: 95 mg/dL (ref 65–99)
Globulin: 2.3 g/dL (calc) (ref 1.9–3.7)
Potassium: 4.5 mmol/L (ref 3.5–5.3)
Sodium: 139 mmol/L (ref 135–146)
Total Bilirubin: 1.6 mg/dL — ABNORMAL HIGH (ref 0.2–1.2)
Total Protein: 6.8 g/dL (ref 6.1–8.1)

## 2017-11-28 LAB — LIPID PANEL
Cholesterol: 195 mg/dL (ref <200)
HDL: 91 mg/dL (ref >50)
LDL CHOLESTEROL (CALC): 88 mg/dL
NON-HDL CHOLESTEROL (CALC): 104 mg/dL (ref <130)
Total CHOL/HDL Ratio: 2.1 (calc) (ref <5.0)
Triglycerides: 72 mg/dL (ref <150)

## 2017-11-28 LAB — TSH: TSH: 0.34 mIU/L — ABNORMAL LOW (ref 0.40–4.50)

## 2017-11-28 LAB — VITAMIN D 25 HYDROXY (VIT D DEFICIENCY, FRACTURES): VIT D 25 HYDROXY: 37 ng/mL (ref 30–100)

## 2017-12-01 ENCOUNTER — Ambulatory Visit (INDEPENDENT_AMBULATORY_CARE_PROVIDER_SITE_OTHER): Payer: Medicare Other | Admitting: Internal Medicine

## 2017-12-01 ENCOUNTER — Encounter: Payer: Self-pay | Admitting: Internal Medicine

## 2017-12-01 VITALS — BP 110/70 | HR 66 | Ht 63.25 in | Wt 124.0 lb

## 2017-12-01 DIAGNOSIS — Z0000 Encounter for general adult medical examination without abnormal findings: Secondary | ICD-10-CM

## 2017-12-01 DIAGNOSIS — F5101 Primary insomnia: Secondary | ICD-10-CM

## 2017-12-01 DIAGNOSIS — M810 Age-related osteoporosis without current pathological fracture: Secondary | ICD-10-CM | POA: Diagnosis not present

## 2017-12-01 DIAGNOSIS — E039 Hypothyroidism, unspecified: Secondary | ICD-10-CM

## 2017-12-01 DIAGNOSIS — Z23 Encounter for immunization: Secondary | ICD-10-CM | POA: Diagnosis not present

## 2017-12-01 LAB — POCT URINALYSIS DIPSTICK
APPEARANCE: NORMAL
Bilirubin, UA: NEGATIVE
Blood, UA: NEGATIVE
Glucose, UA: NEGATIVE
KETONES UA: NEGATIVE
LEUKOCYTES UA: NEGATIVE
NITRITE UA: NEGATIVE
ODOR: NORMAL
PH UA: 6 (ref 5.0–8.0)
PROTEIN UA: NEGATIVE
Spec Grav, UA: 1.015 (ref 1.010–1.025)
UROBILINOGEN UA: 0.2 U/dL

## 2017-12-01 NOTE — Progress Notes (Signed)
Subjective:    Patient ID: Maria Perkins, female    DOB: April 21, 1951, 67 y.o.   MRN: [id number]  HPI 67 year old female in today for Medicare wellness exam, health maintenance exam and evaluation of medical issues.  History of hypothyroidism.  TSH is low at 0.44 and 6 months ago was 1.10.  She will be seeing Dr. Elyse Hsu soon and would prefer having his input before we change her dose. Vitamin D level is stable at 37.  Lipid panel is within normal limits.  Quit smoking in May 1973.  History of hemorrhoids treated with Anusol and Proctofoam.  History of osteoporosis followed by Dr. Elyse Hsu and insomnia.  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  She improved and never required surgery.  She took Fosamax for osteopenia but this was discontinued in 2006 after being on it for a number of years.  Most recent bone density study showed osteoporosis.  Various therapies have been discussed with her.  Social history: She is married.  2 adult daughters.  Husband is former English as a second language teacher of the [place] using record and now works for [employer].  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  [place].  Patient quit smoking in 1973.  Social alcohol consumption consisting of beer and wine.  She is a Writer of [employer]  Family history: 2 brothers in good health.  No sisters.  Mother died at age 73 from complications of a stroke that occurred Dec 18, 1999.  Her father died with history of diabetes and dementia.   Review of Systems  Constitutional: Negative.   Respiratory: Negative.   Cardiovascular: Negative.   Gastrointestinal: Negative.   Genitourinary: Negative.   Neurological: Negative.   Psychiatric/Behavioral: Negative.        Objective:   Physical Exam  Constitutional: She is oriented to person, place, and time. She appears well-developed and  well-nourished. No distress.  HENT:  Head: Normocephalic and atraumatic.  Right Ear: External ear normal.  Left Ear: External ear normal.  Mouth/Throat: Oropharynx is clear and moist.  Eyes: Conjunctivae and EOM are normal. Pupils are equal, round, and reactive to light. Right eye exhibits no discharge. Left eye exhibits no discharge. No scleral icterus.  Neck: Neck supple. No JVD present. No thyromegaly present.  Cardiovascular: Normal rate, normal heart sounds and intact distal pulses.  No murmur heard. Pulmonary/Chest: Effort normal and breath sounds normal. No respiratory distress. She has no wheezes. She has no rales.  Abdominal: Soft. Bowel sounds are normal. She exhibits no distension and no mass. There is no tenderness. There is no rebound and no guarding.  Genitourinary:  Genitourinary Comments: Bimanual normal.  Pap taken 2016 and not to be repeated due to age  Musculoskeletal: She exhibits no edema.  Neurological: She is alert and oriented to person, place, and time. She has normal reflexes. No cranial nerve deficit. Coordination normal.  Skin: Skin is dry. No rash noted. She is not diaphoretic.  Psychiatric: She has a normal mood and affect. Her behavior is normal. Judgment and thought content normal.  Vitals reviewed.         Assessment & Plan:  Hypothyroidism-TSH is low and management will be referred to Dr. Elyse Hsu.  Osteoporosis-followed by Dr. Elyse Hsu.  Has been treated with Reclast  History of insomnia treated with Ambien  History of very mild elevation of total bilirubin which I think is insignificant  Plan: Defer dosage on thyroid replacement to Dr. Elyse Hsu.  Return in 1 year or as needed.  Pneumococcal 23 vaccine given

## 2017-12-04 ENCOUNTER — Ambulatory Visit
Admission: RE | Admit: 2017-12-04 | Discharge: 2017-12-04 | Disposition: A | Discharge status: Home / Self Care | Payer: Medicare Other | Source: Ambulatory Visit | Attending: Internal Medicine | Admitting: Internal Medicine

## 2017-12-04 DIAGNOSIS — Z1231 Encounter for screening mammogram for malignant neoplasm of breast: Secondary | ICD-10-CM

## 2017-12-04 DIAGNOSIS — E2839 Other primary ovarian failure: Secondary | ICD-10-CM

## 2017-12-04 DIAGNOSIS — M8589 Other specified disorders of bone density and structure, multiple sites: Secondary | ICD-10-CM | POA: Diagnosis not present

## 2017-12-04 DIAGNOSIS — M810 Age-related osteoporosis without current pathological fracture: Secondary | ICD-10-CM

## 2017-12-04 DIAGNOSIS — Z780 Asymptomatic menopausal state: Secondary | ICD-10-CM | POA: Diagnosis not present

## 2017-12-04 DIAGNOSIS — E559 Vitamin D deficiency, unspecified: Secondary | ICD-10-CM

## 2017-12-04 DIAGNOSIS — Z1239 Encounter for other screening for malignant neoplasm of breast: Secondary | ICD-10-CM

## 2017-12-07 DIAGNOSIS — M810 Age-related osteoporosis without current pathological fracture: Secondary | ICD-10-CM | POA: Diagnosis not present

## 2017-12-07 DIAGNOSIS — E039 Hypothyroidism, unspecified: Secondary | ICD-10-CM | POA: Diagnosis not present

## 2017-12-07 DIAGNOSIS — Z780 Asymptomatic menopausal state: Secondary | ICD-10-CM | POA: Diagnosis not present

## 2017-12-21 NOTE — Patient Instructions (Signed)
It was a pleasure to see you today.  Continue same medications and follow-up in 1 year or as needed.  Pneumococcal vaccine given today.

## 2018-01-17 ENCOUNTER — Other Ambulatory Visit: Payer: Self-pay | Admitting: Internal Medicine

## 2018-01-17 NOTE — Telephone Encounter (Signed)
Refill x 6 months 

## 2018-01-18 NOTE — Telephone Encounter (Signed)
This is not a patient in [place] and I have never seen her before.

## 2018-01-18 NOTE — Telephone Encounter (Signed)
Refill x 6 months 

## 2018-02-01 ENCOUNTER — Ambulatory Visit (INDEPENDENT_AMBULATORY_CARE_PROVIDER_SITE_OTHER): Payer: Medicare Other | Admitting: Ophthalmology

## 2018-02-08 ENCOUNTER — Ambulatory Visit (INDEPENDENT_AMBULATORY_CARE_PROVIDER_SITE_OTHER): Payer: Medicare Other | Admitting: Ophthalmology

## 2018-02-15 ENCOUNTER — Ambulatory Visit (INDEPENDENT_AMBULATORY_CARE_PROVIDER_SITE_OTHER): Payer: Medicare Other | Admitting: Ophthalmology

## 2018-02-15 DIAGNOSIS — H33302 Unspecified retinal break, left eye: Secondary | ICD-10-CM

## 2018-02-15 DIAGNOSIS — H2513 Age-related nuclear cataract, bilateral: Secondary | ICD-10-CM | POA: Diagnosis not present

## 2018-02-15 DIAGNOSIS — D3131 Benign neoplasm of right choroid: Secondary | ICD-10-CM | POA: Diagnosis not present

## 2018-02-15 DIAGNOSIS — H43813 Vitreous degeneration, bilateral: Secondary | ICD-10-CM

## 2018-02-15 DIAGNOSIS — I10 Essential (primary) hypertension: Secondary | ICD-10-CM

## 2018-02-15 DIAGNOSIS — H35372 Puckering of macula, left eye: Secondary | ICD-10-CM

## 2018-02-15 DIAGNOSIS — H35033 Hypertensive retinopathy, bilateral: Secondary | ICD-10-CM | POA: Diagnosis not present

## 2018-02-15 DIAGNOSIS — H353131 Nonexudative age-related macular degeneration, bilateral, early dry stage: Secondary | ICD-10-CM | POA: Diagnosis not present

## 2018-03-01 IMAGING — MG 2D DIGITAL SCREENING BILATERAL MAMMOGRAM WITH 3D TOMO WITH CAD
9 of 12 series · 9 of 28 positions shown · non-contrast
Comparison: Previous exam(s).

CLINICAL DATA: Screening.

EXAM:
2D DIGITAL SCREENING BILATERAL MAMMOGRAM WITH 3D TOMO WITH CAD

[R CC]
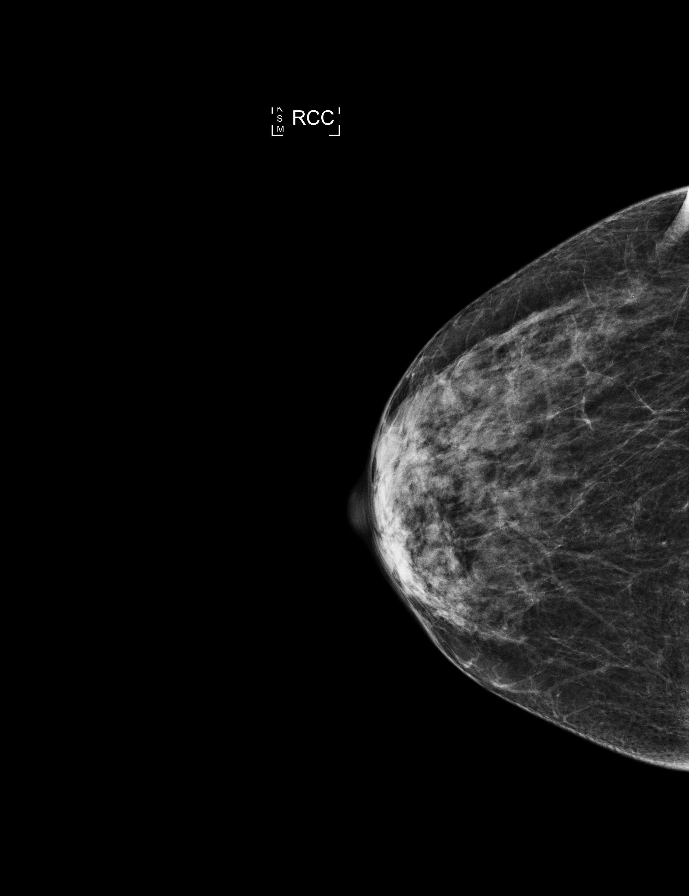

[L CC]
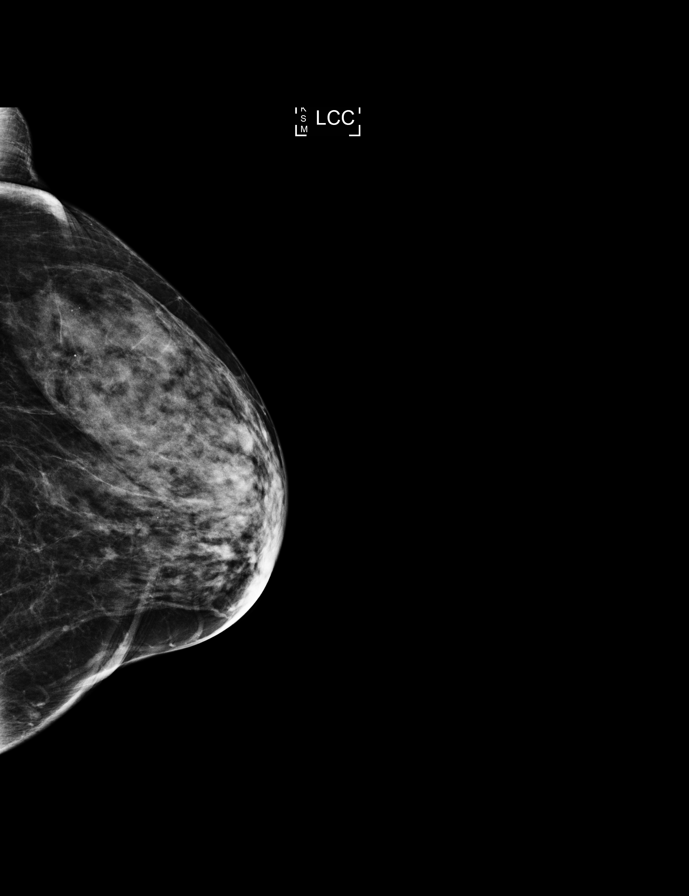

[R CC synth-2D]
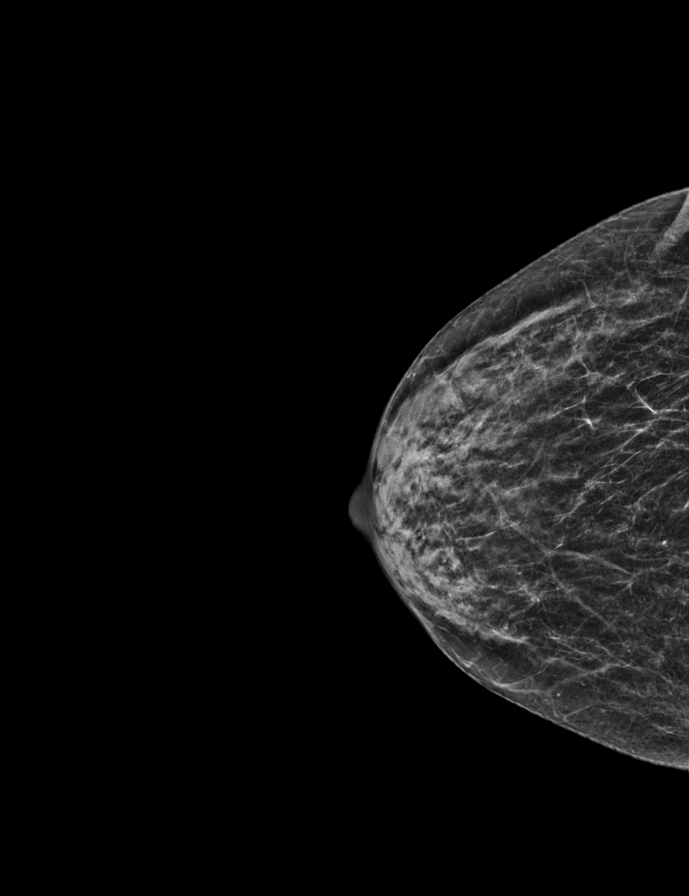

[L MLO]
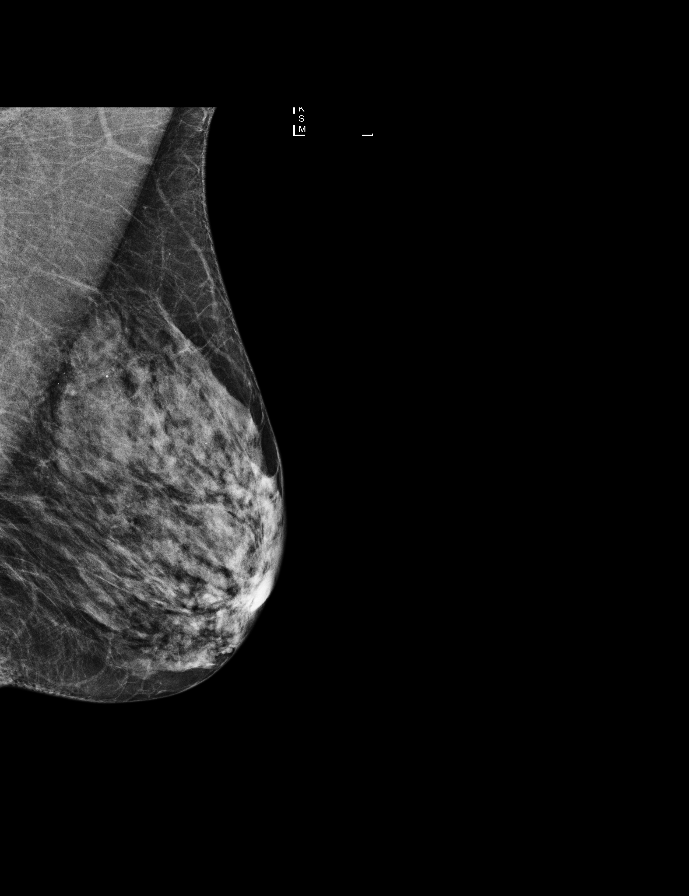

[R MLO synth-2D]
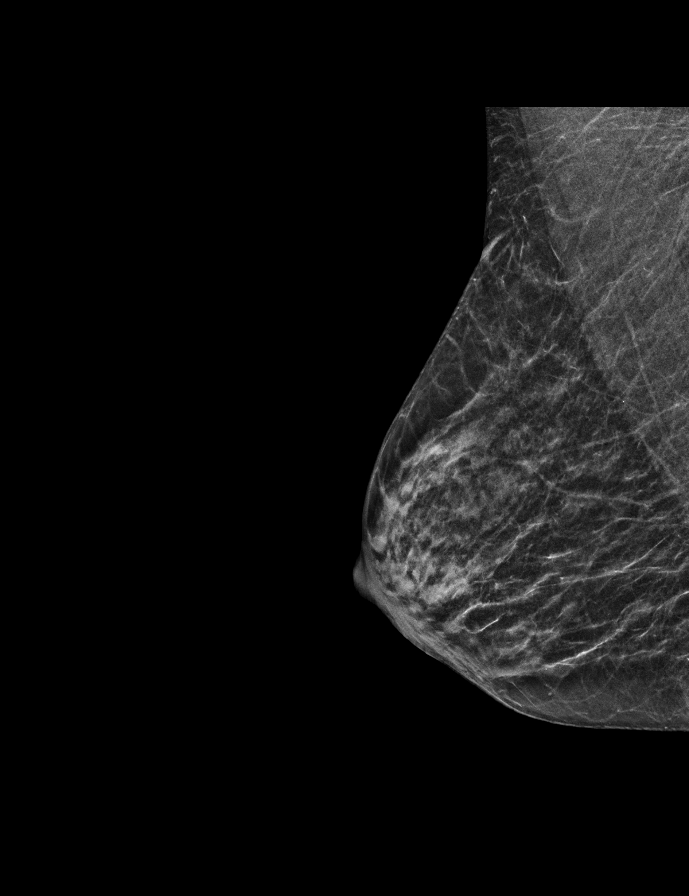

[L CC synth-2D]
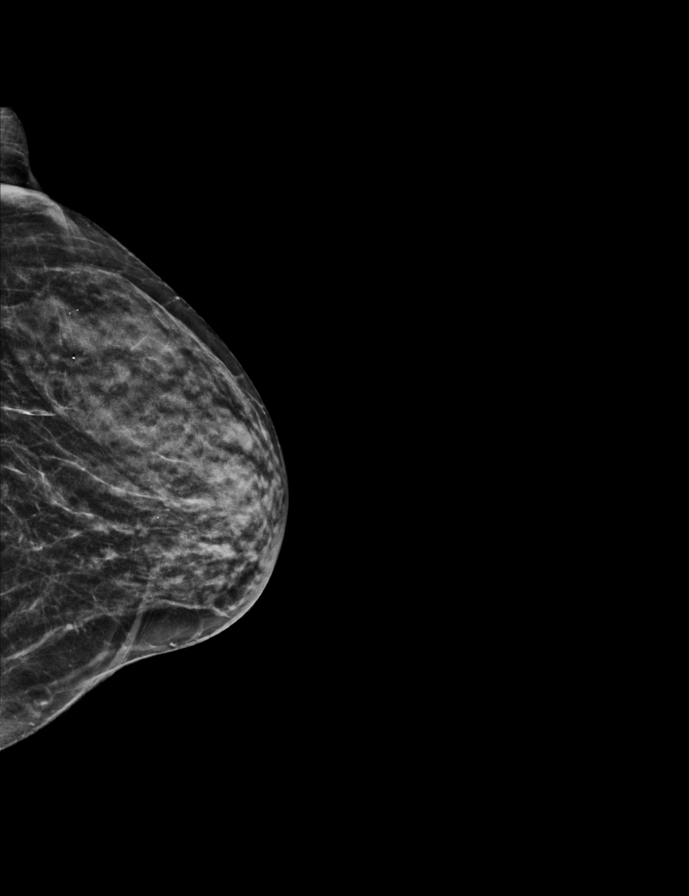

[L MLO synth-2D]
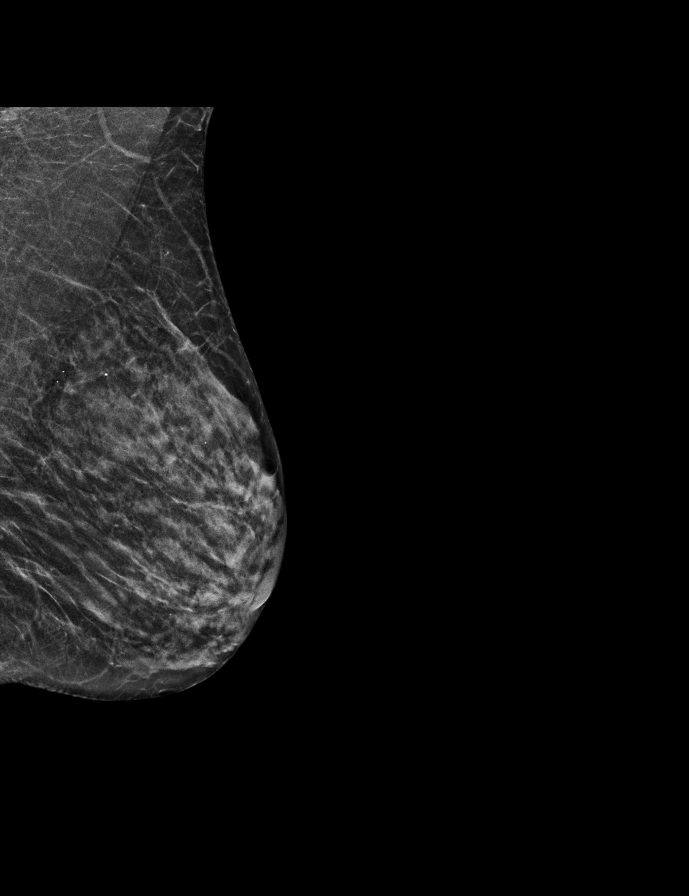

[R MLO]
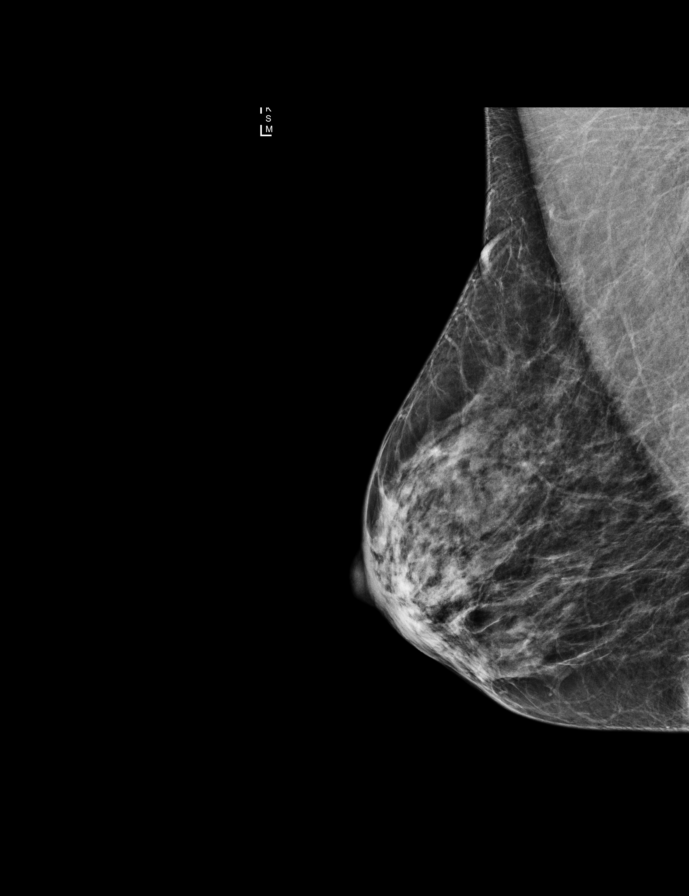

[L MLO tomo · tomo slice 19/37.0]
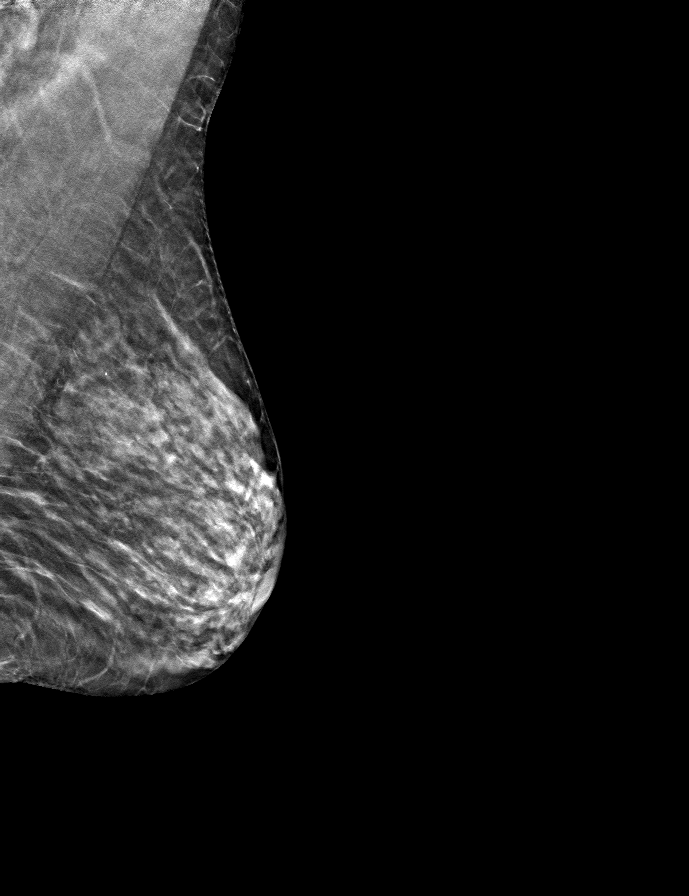

[9 of 28 positions shown; findings below may reference images not displayed]

ACR Breast Density Category c: The breast tissue is heterogeneously
dense, which may obscure small masses.
FINDINGS: There are no findings suspicious for malignancy. Images were
processed with CAD.
IMPRESSION: No mammographic evidence of malignancy. A result letter of this
screening mammogram will be mailed directly to the patient.

RECOMMENDATION:
Screening mammogram in one year. (Code:UA-9-KQN)

BI-RADS CATEGORY  1: Negative.

## 2018-05-11 DIAGNOSIS — H5203 Hypermetropia, bilateral: Secondary | ICD-10-CM | POA: Diagnosis not present

## 2018-05-11 DIAGNOSIS — H52223 Regular astigmatism, bilateral: Secondary | ICD-10-CM | POA: Diagnosis not present

## 2018-05-11 DIAGNOSIS — H25813 Combined forms of age-related cataract, bilateral: Secondary | ICD-10-CM | POA: Diagnosis not present

## 2018-05-28 DIAGNOSIS — E039 Hypothyroidism, unspecified: Secondary | ICD-10-CM | POA: Diagnosis not present

## 2018-05-28 DIAGNOSIS — M810 Age-related osteoporosis without current pathological fracture: Secondary | ICD-10-CM | POA: Diagnosis not present

## 2018-05-31 DIAGNOSIS — M810 Age-related osteoporosis without current pathological fracture: Secondary | ICD-10-CM | POA: Diagnosis not present

## 2018-05-31 DIAGNOSIS — E039 Hypothyroidism, unspecified: Secondary | ICD-10-CM | POA: Diagnosis not present

## 2018-05-31 DIAGNOSIS — Z79890 Hormone replacement therapy (postmenopausal): Secondary | ICD-10-CM | POA: Diagnosis not present

## 2018-09-05 DIAGNOSIS — Z23 Encounter for immunization: Secondary | ICD-10-CM | POA: Diagnosis not present

## 2018-10-14 ENCOUNTER — Telehealth: Payer: Self-pay | Admitting: Internal Medicine

## 2018-10-14 MED ORDER — ZOLPIDEM TARTRATE 5 MG PO TABS: ORAL_TABLET | ORAL | 0 refills | Status: DC

## 2018-10-14 NOTE — Telephone Encounter (Signed)
Refill Ambien x 90 days. Has appt Jan. 2020

## 2018-11-28 ENCOUNTER — Encounter (INDEPENDENT_AMBULATORY_CARE_PROVIDER_SITE_OTHER): Payer: Medicare Other | Admitting: Ophthalmology

## 2018-11-28 DIAGNOSIS — H33302 Unspecified retinal break, left eye: Secondary | ICD-10-CM | POA: Diagnosis not present

## 2018-11-28 DIAGNOSIS — D3131 Benign neoplasm of right choroid: Secondary | ICD-10-CM

## 2018-11-28 DIAGNOSIS — H35372 Puckering of macula, left eye: Secondary | ICD-10-CM

## 2018-11-28 DIAGNOSIS — H2513 Age-related nuclear cataract, bilateral: Secondary | ICD-10-CM

## 2018-11-28 DIAGNOSIS — H353131 Nonexudative age-related macular degeneration, bilateral, early dry stage: Secondary | ICD-10-CM | POA: Diagnosis not present

## 2018-11-28 DIAGNOSIS — H43813 Vitreous degeneration, bilateral: Secondary | ICD-10-CM

## 2018-12-04 ENCOUNTER — Other Ambulatory Visit: Payer: Medicare Other | Admitting: Internal Medicine

## 2018-12-07 ENCOUNTER — Encounter: Payer: Medicare Other | Admitting: Internal Medicine

## 2018-12-11 ENCOUNTER — Encounter: Payer: Medicare Other | Admitting: Internal Medicine

## 2019-01-03 ENCOUNTER — Other Ambulatory Visit: Payer: Medicare Other | Admitting: Internal Medicine

## 2019-01-03 DIAGNOSIS — E039 Hypothyroidism, unspecified: Secondary | ICD-10-CM

## 2019-01-03 DIAGNOSIS — M810 Age-related osteoporosis without current pathological fracture: Secondary | ICD-10-CM | POA: Diagnosis not present

## 2019-01-03 DIAGNOSIS — Z0000 Encounter for general adult medical examination without abnormal findings: Secondary | ICD-10-CM

## 2019-01-03 LAB — COMPLETE METABOLIC PANEL WITH GFR
AG RATIO: 1.8 (calc) (ref 1.0–2.5)
ALT: 14 U/L (ref 6–29)
AST: 22 U/L (ref 10–35)
Albumin: 4.3 g/dL (ref 3.6–5.1)
Alkaline phosphatase (APISO): 58 U/L (ref 37–153)
BUN: 16 mg/dL (ref 7–25)
CALCIUM: 10.2 mg/dL (ref 8.6–10.4)
CO2: 29 mmol/L (ref 20–32)
Chloride: 106 mmol/L (ref 98–110)
Creat: 0.87 mg/dL (ref 0.50–0.99)
GFR, EST AFRICAN AMERICAN: 79 mL/min/{1.73_m2} (ref >=60)
GFR, EST NON AFRICAN AMERICAN: 68 mL/min/{1.73_m2} (ref >=60)
GLOBULIN: 2.4 g/dL (ref 1.9–3.7)
Glucose, Bld: 96 mg/dL (ref 65–99)
POTASSIUM: 5.6 mmol/L — AB (ref 3.5–5.3)
Sodium: 142 mmol/L (ref 135–146)
Total Bilirubin: 1.2 mg/dL (ref 0.2–1.2)
Total Protein: 6.7 g/dL (ref 6.1–8.1)

## 2019-01-03 LAB — CBC WITH DIFFERENTIAL/PLATELET
ABSOLUTE MONOCYTES: 361 {cells}/uL (ref 200–950)
BASOS PCT: 0.7 %
Basophils Absolute: 29 cells/uL (ref 0–200)
Eosinophils Absolute: 50 cells/uL (ref 15–500)
Eosinophils Relative: 1.2 %
HCT: 42.3 % (ref 35.0–45.0)
Hemoglobin: 14.1 g/dL (ref 11.7–15.5)
LYMPHS ABS: 1369 {cells}/uL (ref 850–3900)
MCH: 30.6 pg (ref 27.0–33.0)
MCHC: 33.3 g/dL (ref 32.0–36.0)
MCV: 91.8 fL (ref 80.0–100.0)
MPV: 10.3 fL (ref 7.5–12.5)
Monocytes Relative: 8.6 %
Neutro Abs: 2390 cells/uL (ref 1500–7800)
Neutrophils Relative %: 56.9 %
Platelets: 223 10*3/uL (ref 140–400)
RBC: 4.61 10*6/uL (ref 3.80–5.10)
RDW: 12.7 % (ref 11.0–15.0)
Total Lymphocyte: 32.6 %
WBC: 4.2 10*3/uL (ref 3.8–10.8)

## 2019-01-03 LAB — LIPID PANEL
Cholesterol: 203 mg/dL — ABNORMAL HIGH (ref <200)
HDL: 82 mg/dL (ref >=50)
LDL Cholesterol (Calc): 106 mg/dL (calc) — ABNORMAL HIGH
Non-HDL Cholesterol (Calc): 121 mg/dL (calc) (ref <130)
TRIGLYCERIDES: 60 mg/dL (ref <150)
Total CHOL/HDL Ratio: 2.5 (calc) (ref <5.0)

## 2019-01-03 LAB — TSH: TSH: 0.95 m[IU]/L (ref 0.40–4.50)

## 2019-01-07 ENCOUNTER — Encounter: Payer: Self-pay | Admitting: Internal Medicine

## 2019-01-07 ENCOUNTER — Ambulatory Visit (INDEPENDENT_AMBULATORY_CARE_PROVIDER_SITE_OTHER): Payer: Medicare Other | Admitting: Internal Medicine

## 2019-01-07 VITALS — BP 92/70 | HR 66 | Ht 63.0 in | Wt 129.0 lb

## 2019-01-07 DIAGNOSIS — E875 Hyperkalemia: Secondary | ICD-10-CM | POA: Diagnosis not present

## 2019-01-07 DIAGNOSIS — F5101 Primary insomnia: Secondary | ICD-10-CM | POA: Diagnosis not present

## 2019-01-07 DIAGNOSIS — E039 Hypothyroidism, unspecified: Secondary | ICD-10-CM

## 2019-01-07 DIAGNOSIS — M810 Age-related osteoporosis without current pathological fracture: Secondary | ICD-10-CM

## 2019-01-07 DIAGNOSIS — Z0000 Encounter for general adult medical examination without abnormal findings: Secondary | ICD-10-CM | POA: Diagnosis not present

## 2019-01-07 LAB — POCT URINALYSIS DIPSTICK
APPEARANCE: NEGATIVE
BILIRUBIN UA: NEGATIVE
GLUCOSE UA: NEGATIVE
Ketones, UA: NEGATIVE
Leukocytes, UA: NEGATIVE
Nitrite, UA: NEGATIVE
Odor: NEGATIVE
Protein, UA: NEGATIVE
RBC UA: NEGATIVE
Spec Grav, UA: 1.01 (ref 1.010–1.025)
Urobilinogen, UA: 0.2 E.U./dL
pH, UA: 6.5 (ref 5.0–8.0)

## 2019-01-07 LAB — POTASSIUM: POTASSIUM: 4.9 mmol/L (ref 3.5–5.3)

## 2019-01-07 NOTE — Progress Notes (Signed)
Subjective:    Patient ID: Maria Perkins, female    DOB: [birth year], 68 y.o.   MRN: [id number]  HPI 68 year old Female for Medicare wellness, health maintenance exam and evaluation of medical issues.  She has a history of hypothyroidism and sees Dr. Rayann Heman however.  History of osteoporosis and insomnia.  She takes non-generic Synthroid per Dr. Elyse Hsu.  Potassium was elevated recently on lab studies and was likely hemolyzed.  It was repeated and is normal at 4.9.  Lipid panel is essentially normal total cholesterol was 203, LDL cholesterol 106 with normal triglycerides and HDL is excellent at 82.  Vitamin D was not checked with this visit.  It was checked in January 2019 and was normal at 37.  Medicare does not cover vitamin D levels.  She saw Dr. Elyse Hsu in July 2019.  He recommended repeat bone density every 2 years.  He recommends repeat study in 2021.  She has been intolerant of oral bisphosphonates.  She was treated with Reclast times 02/07/2014, 2017 and 2018.  He also follows her for hypothyroidism.  He sees her annually.  She is complaining about hair thinning.  Dr. Eden Emms suggested Dr. Leonie Green, dermatologist at [facility].  She smoked for a couple of years in her late 67s.  History of hemorrhoids treated with Anusol or Proctofoam  Past medical history: In 1996 she was diagnosed with a cervical disc bulge at C5-C6 treated with steroids and traction.  She improved and never required surgery.  Social history: She is married.  2 adult daughters.  Husband is former English as a second language teacher of the [employer] and now teaches at [employer].  Social alcohol consumption.  She is a Writer of [employer].  Family history: 2 brothers in good health.  No sisters.  Mother died at age 24 from complications of a stroke that occurred in 11-17-1999.  Father died with history of diabetes and dementia.      Review of Systems  Constitutional: Negative.     Respiratory: Negative.   Cardiovascular: Negative.   Gastrointestinal: Negative.   Genitourinary: Negative.   Neurological: Negative.   Psychiatric/Behavioral: Negative.   All other systems reviewed and are negative.      Objective:   Physical Exam Vitals signs reviewed.  Constitutional:      General: She is not in acute distress.    Appearance: Normal appearance.  HENT:     Head: Normocephalic.     Right Ear: Tympanic membrane normal.     Left Ear: Tympanic membrane normal.     Nose: Nose normal.     Mouth/Throat:     Mouth: Mucous membranes are moist.     Pharynx: Oropharynx is clear. No oropharyngeal exudate.  Eyes:     General: No scleral icterus.       Right eye: No discharge.        Left eye: No discharge.     Extraocular Movements: Extraocular movements intact.     Conjunctiva/sclera: Conjunctivae normal.     Pupils: Pupils are equal, round, and reactive to light.  Neck:     Musculoskeletal: Neck supple. No neck rigidity.  Cardiovascular:     Rate and Rhythm: Normal rate and regular rhythm.     Pulses: Normal pulses.     Heart sounds: Normal heart sounds. No murmur.  Pulmonary:     Effort: Pulmonary effort is normal. No respiratory distress.     Breath sounds: Normal  breath sounds. No wheezing or rales.  Abdominal:     General: Bowel sounds are normal.     Palpations: Abdomen is soft. There is no mass.     Tenderness: There is no abdominal tenderness. There is no guarding or rebound.  Genitourinary:    Comments: Pap taken 2016 and not to be repeated due to age Musculoskeletal:        General: No swelling.     Right lower leg: No edema.     Left lower leg: No edema.  Lymphadenopathy:     Cervical: No cervical adenopathy.  Skin:    General: Skin is warm and dry.     Findings: No rash.  Neurological:     General: No focal deficit present.     Mental Status: She is alert and oriented to person, place, and time.     Cranial Nerves: No cranial nerve  deficit.     Sensory: No sensory deficit.     Coordination: Coordination normal.  Psychiatric:        Mood and Affect: Mood normal.        Behavior: Behavior normal.        Thought Content: Thought content normal.        Judgment: Judgment normal.           Assessment & Plan:  Hypothyroidism-followed by Dr. Elyse Hsu yearly.  TSH is normal at 0.95  Osteoporosis status post Reclast treatments followed by Dr.Altheimer  with repeat bone density study recommended 2021  History of insomnia treated with Ambien  Elevated serum potassium-likely lab error and will be repeated.  Level was 5.6  Addendum: Repeat potassium is normal at 4.9  Plan: Continue vitamin D supplementation.  Continue same dose of thyroid replacement.  Return here in 1 year or as needed  Subjective:   Patient presents for Medicare Annual/Subsequent preventive examination.  Review Past Medical/Family/Social: See above   Risk Factors  Current exercise habits: Exercises regularly Dietary issues discussed: Low-fat low carbohydrate  Cardiac risk factors: Family history of stroke in mother  Depression Screen  (Note: if answer to either of the following is "Yes", a more complete depression screening is indicated)   Over the past two weeks, have you felt down, depressed or hopeless? No  Over the past two weeks, have you felt little interest or pleasure in doing things? No Have you lost interest or pleasure in daily life? No Do you often feel hopeless? No Do you cry easily over simple problems? No   Activities of Daily Living  In your present state of health, do you have any difficulty performing the following activities?:   Driving? No  Managing money? No  Feeding yourself? No  Getting from bed to chair? No  Climbing a flight of stairs? No  Preparing food and eating?: No  Bathing or showering? No  Getting dressed: No  Getting to the toilet? No  Using the toilet:No  Moving around from place to place: No   In the past year have you fallen or had a near fall?:No  Are you sexually active? No  Do you have more than one partner? No   Hearing Difficulties: No  Do you often ask people to speak up or repeat themselves? No  Do you experience ringing or noises in your ears? No  Do you have difficulty understanding soft or whispered voices? No  Do you feel that you have a problem with memory? No Do you often misplace items? No  Home Safety:  Do you have a smoke alarm at your residence? Yes Do you have grab bars in the bathroom?  Yes  do you have throw rugs in your house?  No   Cognitive Testing  Alert? Yes Normal Appearance?Yes  Oriented to person? Yes Place? Yes  Time? Yes  Recall of three objects? Yes  Can perform simple calculations? Yes  Displays appropriate judgment?Yes  Can read the correct time from a watch face?Yes   List the Names of Other Physician/Practitioners you currently use:  See referral list for the physicians patient is currently seeing.  Dr. Elyse Hsu   Review of Systems: See above   Objective:     General appearance: Appears younger than stated age and trim Head: Normocephalic, without obvious abnormality, atraumatic  Eyes: conj clear, EOMi PEERLA  Ears: normal TM's and external ear canals both ears  Nose: Nares normal. Septum midline. Mucosa normal. No drainage or sinus tenderness.  Throat: lips, mucosa, and tongue normal; teeth and gums normal  Neck: no adenopathy, no carotid bruit, no JVD, supple, symmetrical, trachea midline and thyroid not enlarged, symmetric, no tenderness/mass/nodules  No CVA tenderness.  Lungs: clear to auscultation bilaterally  Breasts: normal appearance, no masses or tenderness, Heart: regular rate and rhythm, S1, S2 normal, no murmur, click, rub or gallop  Abdomen: soft, non-tender; bowel sounds normal; no masses, no organomegaly  Musculoskeletal: ROM normal in all joints, no crepitus, no deformity, Normal muscle strengthen.  Back  is symmetric, no curvature. Skin: Skin color, texture, turgor normal. No rashes or lesions  Lymph nodes: Cervical, supraclavicular, and axillary nodes normal.  Neurologic: CN 2 -12 Normal, Normal symmetric reflexes. Normal coordination and gait  Psych: Alert & Oriented x 3, Mood appear stable.    Assessment:    Annual wellness medicare exam   Plan:    During the course of the visit the patient was educated and counseled about appropriate screening and preventive services including:   Recommend annual flu vaccine  Has had Shingrix  Tetanus immunization is up-to-date  Pneumococcal vaccines up-to-date  Had flu vaccine in October     Patient Instructions (the written plan) was given to the patient.  Medicare Attestation  I have personally reviewed:  The patient's medical and social history  Their use of alcohol, tobacco or illicit drugs  Their current medications and supplements  The patient's functional ability including ADLs,fall risks, home safety risks, cognitive, and hearing and visual impairment  Diet and physical activities  Evidence for depression or mood disorders  The patient's weight, height, BMI, and visual acuity have been recorded in the chart. I have made referrals, counseling, and provided education to the patient based on review of the above and I have provided the patient with a written personalized care plan for preventive services.

## 2019-01-08 ENCOUNTER — Other Ambulatory Visit: Payer: Self-pay | Admitting: Internal Medicine

## 2019-01-08 DIAGNOSIS — Z1231 Encounter for screening mammogram for malignant neoplasm of breast: Secondary | ICD-10-CM

## 2019-01-19 ENCOUNTER — Encounter: Payer: Self-pay | Admitting: Internal Medicine

## 2019-01-19 NOTE — Patient Instructions (Signed)
It was a pleasure to see you today.  Continue to follow-up with Dr. Eden Emms regarding hypothyroidism and osteoporosis.  Continue medication for insomnia.  Return in 1 year or as needed.  Have annual mammogram.  Have annual flu vaccine.  Immunizations are up-to-date.

## 2019-02-05 ENCOUNTER — Ambulatory Visit
Admission: RE | Admit: 2019-02-05 | Discharge: 2019-02-05 | Disposition: A | Discharge status: Home / Self Care | Payer: Medicare Other | Source: Ambulatory Visit | Attending: Internal Medicine | Admitting: Internal Medicine

## 2019-02-05 ENCOUNTER — Other Ambulatory Visit: Payer: Self-pay

## 2019-02-05 DIAGNOSIS — Z1231 Encounter for screening mammogram for malignant neoplasm of breast: Secondary | ICD-10-CM

## 2019-05-27 DIAGNOSIS — E039 Hypothyroidism, unspecified: Secondary | ICD-10-CM | POA: Diagnosis not present

## 2019-05-27 DIAGNOSIS — M810 Age-related osteoporosis without current pathological fracture: Secondary | ICD-10-CM | POA: Diagnosis not present

## 2019-05-31 DIAGNOSIS — M810 Age-related osteoporosis without current pathological fracture: Secondary | ICD-10-CM | POA: Diagnosis not present

## 2019-05-31 DIAGNOSIS — E039 Hypothyroidism, unspecified: Secondary | ICD-10-CM | POA: Diagnosis not present

## 2019-06-24 DIAGNOSIS — D225 Melanocytic nevi of trunk: Secondary | ICD-10-CM | POA: Diagnosis not present

## 2019-06-24 DIAGNOSIS — L821 Other seborrheic keratosis: Secondary | ICD-10-CM | POA: Diagnosis not present

## 2019-06-24 DIAGNOSIS — D2262 Melanocytic nevi of left upper limb, including shoulder: Secondary | ICD-10-CM | POA: Diagnosis not present

## 2019-06-24 DIAGNOSIS — D2261 Melanocytic nevi of right upper limb, including shoulder: Secondary | ICD-10-CM | POA: Diagnosis not present

## 2019-08-01 ENCOUNTER — Other Ambulatory Visit: Payer: Self-pay | Admitting: Internal Medicine

## 2019-08-01 NOTE — Telephone Encounter (Signed)
Maria Perkins [phone]  [facility] [phone] -- [place] Phone [phone]  zolpidem [name]) 5 MG tablet    Nick called to say she is out of town and forgot her prescription, could you possible send a prescription for her.

## 2019-08-02 MED ORDER — ZOLPIDEM TARTRATE 5 MG PO TABS: ORAL_TABLET | ORAL | 0 refills | Status: DC

## 2019-09-04 ENCOUNTER — Encounter (INDEPENDENT_AMBULATORY_CARE_PROVIDER_SITE_OTHER): Payer: BLUE CROSS/BLUE SHIELD | Admitting: Ophthalmology

## 2019-09-04 ENCOUNTER — Other Ambulatory Visit: Payer: Self-pay

## 2019-09-04 DIAGNOSIS — D3131 Benign neoplasm of right choroid: Secondary | ICD-10-CM

## 2019-09-04 DIAGNOSIS — H33302 Unspecified retinal break, left eye: Secondary | ICD-10-CM | POA: Diagnosis not present

## 2019-09-04 DIAGNOSIS — H353111 Nonexudative age-related macular degeneration, right eye, early dry stage: Secondary | ICD-10-CM

## 2019-09-04 DIAGNOSIS — H35372 Puckering of macula, left eye: Secondary | ICD-10-CM

## 2019-09-04 DIAGNOSIS — H43813 Vitreous degeneration, bilateral: Secondary | ICD-10-CM

## 2019-09-04 DIAGNOSIS — H2513 Age-related nuclear cataract, bilateral: Secondary | ICD-10-CM

## 2019-11-27 DIAGNOSIS — L905 Scar conditions and fibrosis of skin: Secondary | ICD-10-CM | POA: Diagnosis not present

## 2019-11-27 DIAGNOSIS — L820 Inflamed seborrheic keratosis: Secondary | ICD-10-CM | POA: Diagnosis not present

## 2019-11-27 DIAGNOSIS — D485 Neoplasm of uncertain behavior of skin: Secondary | ICD-10-CM | POA: Diagnosis not present

## 2019-12-21 ENCOUNTER — Ambulatory Visit: Payer: Medicare Other

## 2019-12-27 ENCOUNTER — Ambulatory Visit: Payer: Medicare Other

## 2020-01-01 ENCOUNTER — Ambulatory Visit: Payer: Medicare Other

## 2020-01-30 ENCOUNTER — Other Ambulatory Visit: Payer: Self-pay

## 2020-01-30 ENCOUNTER — Other Ambulatory Visit: Payer: Medicare Other | Admitting: Internal Medicine

## 2020-01-31 ENCOUNTER — Encounter: Payer: Self-pay | Admitting: Internal Medicine

## 2020-01-31 ENCOUNTER — Ambulatory Visit
Admission: RE | Admit: 2020-01-31 | Discharge: 2020-01-31 | Disposition: A | Discharge status: Home / Self Care | Payer: Medicare Other | Source: Ambulatory Visit | Attending: Internal Medicine | Admitting: Internal Medicine

## 2020-01-31 ENCOUNTER — Other Ambulatory Visit: Payer: Self-pay

## 2020-01-31 ENCOUNTER — Ambulatory Visit (INDEPENDENT_AMBULATORY_CARE_PROVIDER_SITE_OTHER): Payer: Medicare Other | Admitting: Internal Medicine

## 2020-01-31 VITALS — BP 110/70 | HR 82 | Temp 98.0°F | Ht 63.0 in | Wt 114.0 lb

## 2020-01-31 DIAGNOSIS — E039 Hypothyroidism, unspecified: Secondary | ICD-10-CM

## 2020-01-31 DIAGNOSIS — S60022A Contusion of left index finger without damage to nail, initial encounter: Secondary | ICD-10-CM

## 2020-01-31 DIAGNOSIS — M7989 Other specified soft tissue disorders: Secondary | ICD-10-CM | POA: Diagnosis not present

## 2020-01-31 DIAGNOSIS — F5101 Primary insomnia: Secondary | ICD-10-CM

## 2020-01-31 DIAGNOSIS — M810 Age-related osteoporosis without current pathological fracture: Secondary | ICD-10-CM

## 2020-01-31 DIAGNOSIS — S6992XA Unspecified injury of left wrist, hand and finger(s), initial encounter: Secondary | ICD-10-CM | POA: Diagnosis not present

## 2020-01-31 DIAGNOSIS — W19XXXA Unspecified fall, initial encounter: Secondary | ICD-10-CM

## 2020-01-31 DIAGNOSIS — E7800 Pure hypercholesterolemia, unspecified: Secondary | ICD-10-CM

## 2020-01-31 DIAGNOSIS — Z0000 Encounter for general adult medical examination without abnormal findings: Secondary | ICD-10-CM | POA: Diagnosis not present

## 2020-01-31 DIAGNOSIS — S40011A Contusion of right shoulder, initial encounter: Secondary | ICD-10-CM

## 2020-01-31 DIAGNOSIS — E785 Hyperlipidemia, unspecified: Secondary | ICD-10-CM | POA: Diagnosis not present

## 2020-01-31 DIAGNOSIS — M79645 Pain in left finger(s): Secondary | ICD-10-CM | POA: Diagnosis not present

## 2020-01-31 LAB — POCT URINALYSIS DIPSTICK
Appearance: NEGATIVE
Bilirubin, UA: NEGATIVE
Blood, UA: NEGATIVE
Glucose, UA: NEGATIVE
Ketones, UA: NEGATIVE
Leukocytes, UA: NEGATIVE
Nitrite, UA: NEGATIVE
Odor: NEGATIVE
Protein, UA: NEGATIVE
Spec Grav, UA: 1.015 (ref 1.010–1.025)
Urobilinogen, UA: 0.2 E.U./dL
pH, UA: 6.5 (ref 5.0–8.0)

## 2020-01-31 NOTE — Progress Notes (Signed)
Subjective:    Patient ID: Maria Perkins, female    DOB: 02-20-1951, 69 y.o.   MRN: [id number]  HPI 69 year old Female for Medicare wellness, health maintenance exam and evaluation of medical issues.  Fell walking on a  trail recently.Tripped on a root. Has left index finger in a splint. It is contused with decrease ROM. Plan to Xray finger. No hx of frequent or recurrent falls.  Bruised right shoulder with the fall.  History of hypothyroidism.  History of osteoporosis and insomnia.  She takes nongeneric Synthroid by Dr. Elyse Hsu.  She saw Dr. Elyse Hsu in July.  She currently is on Synthroid 0.112 mg 6 days a week and 1.5 tablets 1 day out of the week for an average daily dose of 120 mcg.  She took Reclast 2015 2017 and 2018.  Has been intolerant of oral bisphosphonates.  Recommend adequate vitamin D intake.  Recommend DEXA scan every 2 years.  Past medical history: In 1996 she was diagnosed with cervical disc bulge at C 5-C6 treated with steroids and traction.  She improved and never required surgery.  She smoked for couple of years in her late 56s.  Social alcohol consumption daily.  Social history: She is married.  2 adult daughters.  Husband is a former English as a second language teacher of the [employer] in our teaches at [employer].  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  2 adult daughters.  Family history: 2 brothers in good health.  Mother died at age 27 from complications of a stroke that occurred Nov 17, 1999.  Father died with history of diabetes and dementia.  Has received 2 COVID-19 immunizations.            Review of Systems  Respiratory: Negative.   Cardiovascular: Negative.   Gastrointestinal: Negative.   Genitourinary: Negative.   Neurological: Negative.   Psychiatric/Behavioral: Negative.        Objective:   Physical Exam Constitutional:      Appearance: Normal appearance.  HENT:      Head: Normocephalic and atraumatic.     Right Ear: Tympanic membrane normal.     Left Ear: Tympanic membrane normal.     Nose: Nose normal.  Eyes:     General: No scleral icterus.       Right eye: No discharge.        Left eye: No discharge.     Extraocular Movements: Extraocular movements intact.     Conjunctiva/sclera: Conjunctivae normal.     Pupils: Pupils are equal, round, and reactive to light.  Neck:     Vascular: No carotid bruit.     Comments: No thyromegaly Cardiovascular:     Rate and Rhythm: Normal rate and regular rhythm.     Heart sounds: Normal heart sounds. No murmur.     Comments: Breast without masses Pulmonary:     Effort: Pulmonary effort is normal. No respiratory distress.     Breath sounds: Normal breath sounds. No wheezing.  Abdominal:     General: There is no distension.     Palpations: Abdomen is soft. There is no mass.     Tenderness: There is no abdominal tenderness. There is no rebound.  Genitourinary:    Comments: Bimanual normal.  Pap deferred due to age. Musculoskeletal:     Right lower leg: No edema.     Left lower leg: No edema.     Comments: Contusion right  shoulder.  Bruising left index finger with decreased range of motion secondary to swelling and bruising  Lymphadenopathy:     Cervical: No cervical adenopathy.  Skin:    General: Skin is warm and dry.     Findings: No rash.  Neurological:     General: No focal deficit present.     Mental Status: She is alert and oriented to person, place, and time.     Cranial Nerves: No cranial nerve deficit.     Motor: No weakness.  Psychiatric:        Mood and Affect: Mood normal.        Behavior: Behavior normal.        Thought Content: Thought content normal.        Judgment: Judgment normal.           Assessment & Plan:  Contusion left index finger secondary to a fall  Contusion right shoulder secondary to a fall  Fall occurred on a trail when she tripped over a wound accidentally.   She will have x-ray of left index finger.  Does not feel that she needs to have x-ray of right shoulder.  History of insomnia longstanding treated with Ambien  Hypothyroidism-treated by Dr. Elyse Hsu with Synthroid 0.112 mg daily with an extra half dose on Sunday.  History of osteoporosis treated with Reclast and followed by Dr. Elyse Hsu.  History of elevated LDL treated with diet.  Current LDL is 103 and stable  Potassium is 5.5 but I think this is consistent with hemolyzed specimen and has happened before with her.  It will not be repeated  Plan: X-ray of left index finger.  Continue current medications and follow-up in 1 year or as needed.  Subjective:   Patient presents for Medicare Annual/Subsequent preventive examination.  Review Past Medical/Family/Social: See above   Risk Factors  Current exercise habits: Exercises regularly Dietary issues discussed: Low-fat low carbohydrate  Cardiac risk factors: Family history of stroke in mother.  Mild elevated LDL  Depression Screen  (Note: if answer to either of the following is "Yes", a more complete depression screening is indicated)   Over the past two weeks, have you felt down, depressed or hopeless? No  Over the past two weeks, have you felt little interest or pleasure in doing things? No Have you lost interest or pleasure in daily life? No Do you often feel hopeless? No Do you cry easily over simple problems? No   Activities of Daily Living  In your present state of health, do you have any difficulty performing the following activities?:   Driving? No  Managing money? No  Feeding yourself? No  Getting from bed to chair? No  Climbing a flight of stairs? No  Preparing food and eating?: No  Bathing or showering? No  Getting dressed: No  Getting to the toilet? No  Using the toilet:No  Moving around from place to place: No  In the past year have you fallen or had a near fall?:  Yes just recently hiking on a  trail-tripped over a root Are you sexually active? No  Do you have more than one partner? No   Hearing Difficulties: No  Do you often ask people to speak up or repeat themselves? No  Do you experience ringing or noises in your ears? No  Do you have difficulty understanding soft or whispered voices? No  Do you feel that you have a problem with memory? No Do you often misplace items? No  Home Safety:  Do you have a smoke alarm at your residence? Yes Do you have grab bars in the bathroom?  Yes Do you have throw rugs in your house?  None   Cognitive Testing  Alert? Yes Normal Appearance?Yes  Oriented to person? Yes Place? Yes  Time? Yes  Recall of three objects? Yes  Can perform simple calculations? Yes  Displays appropriate judgment?Yes  Can read the correct time from a watch face?Yes   List the Names of Other Physician/Practitioners you currently use:  See referral list for the physicians patient is currently seeing.  Dr. Elyse Hsu   Review of Systems: See above  Objective:     General appearance: No acute distress.  Appears younger than stated age. Head: Normocephalic, without obvious abnormality, atraumatic  Eyes: conj clear, EOMi PEERLA  Ears: normal TM's and external ear canals both ears  Nose: Nares normal. Septum midline. Mucosa normal. No drainage or sinus tenderness.  Throat: lips, mucosa, and tongue normal; teeth and gums normal  Neck: no adenopathy, no carotid bruit, no JVD, supple, symmetrical, trachea midline and thyroid not enlarged, symmetric, no tenderness/mass/nodules  No CVA tenderness.  Lungs: clear to auscultation bilaterally  Breasts: normal appearance, no masses or tenderness Heart: regular rate and rhythm, S1, S2 normal, no murmur, click, rub or gallop  Abdomen: soft, non-tender; bowel sounds normal; no masses, no organomegaly  Musculoskeletal: ROM normal in all joints, no crepitus, no deformity, Normal muscle strengthen. Back  is symmetric, no  curvature. Skin: Skin color, texture, turgor normal. No rashes or lesions  Lymph nodes: Cervical, supraclavicular, and axillary nodes normal.  Neurologic: CN 2 -12 Normal, Normal symmetric reflexes. Normal coordination and gait  Psych: Alert & Oriented x 3, Mood appear stable.   Contusion and swelling left index finger with decreased range of motion Assessment:    Annual wellness medicare exam   Plan:    During the course of the visit the patient was educated and counseled about appropriate screening and preventive services including:     Annual flu vaccine  Has had COVID-19 vaccine   Patient Instructions (the written plan) was given to the patient.  Medicare Attestation  I have personally reviewed:  The patient's medical and social history  Their use of alcohol, tobacco or illicit drugs  Their current medications and supplements  The patient's functional ability including ADLs,fall risks, home safety risks, cognitive, and hearing and visual impairment  Diet and physical activities  Evidence for depression or mood disorders  The patient's weight, height, BMI, and visual acuity have been recorded in the chart. I have made referrals, counseling, and provided education to the patient based on review of the above and I have provided the patient with a written personalized care plan for preventive services.

## 2020-02-01 LAB — COMPLETE METABOLIC PANEL WITH GFR
AG Ratio: 2 (calc) (ref 1.0–2.5)
ALT: 15 U/L (ref 6–29)
AST: 24 U/L (ref 10–35)
Albumin: 4.3 g/dL (ref 3.6–5.1)
Alkaline phosphatase (APISO): 61 U/L (ref 37–153)
BUN: 15 mg/dL (ref 7–25)
CO2: 26 mmol/L (ref 20–32)
Calcium: 10.3 mg/dL (ref 8.6–10.4)
Chloride: 104 mmol/L (ref 98–110)
Creat: 0.77 mg/dL (ref 0.60–0.93)
GFR, Est African American: 91 mL/min/{1.73_m2} (ref >=60)
GFR, Est Non African American: 78 mL/min/{1.73_m2} (ref >=60)
Globulin: 2.2 g/dL (calc) (ref 1.9–3.7)
Glucose, Bld: 94 mg/dL (ref 65–99)
Potassium: 5.5 mmol/L — ABNORMAL HIGH (ref 3.5–5.3)
Sodium: 141 mmol/L (ref 135–146)
Total Bilirubin: 1.2 mg/dL (ref 0.2–1.2)
Total Protein: 6.5 g/dL (ref 6.1–8.1)

## 2020-02-01 LAB — CBC WITH DIFFERENTIAL/PLATELET
Absolute Monocytes: 460 cells/uL (ref 200–950)
Basophils Absolute: 50 cells/uL (ref 0–200)
Basophils Relative: 1 %
Eosinophils Absolute: 130 cells/uL (ref 15–500)
Eosinophils Relative: 2.6 %
HCT: 43.2 % (ref 35.0–45.0)
Hemoglobin: 14.2 g/dL (ref 11.7–15.5)
Lymphs Abs: 1650 cells/uL (ref 850–3900)
MCH: 30.4 pg (ref 27.0–33.0)
MCHC: 32.9 g/dL (ref 32.0–36.0)
MCV: 92.5 fL (ref 80.0–100.0)
MPV: 9.8 fL (ref 7.5–12.5)
Monocytes Relative: 9.2 %
Neutro Abs: 2710 cells/uL (ref 1500–7800)
Neutrophils Relative %: 54.2 %
Platelets: 242 10*3/uL (ref 140–400)
RBC: 4.67 10*6/uL (ref 3.80–5.10)
RDW: 12.2 % (ref 11.0–15.0)
Total Lymphocyte: 33 %
WBC: 5 10*3/uL (ref 3.8–10.8)

## 2020-02-01 LAB — TSH: TSH: 0.45 mIU/L (ref 0.40–4.50)

## 2020-02-01 LAB — LIPID PANEL
Cholesterol: 198 mg/dL (ref <200)
HDL: 79 mg/dL (ref >=50)
LDL Cholesterol (Calc): 103 mg/dL (calc) — ABNORMAL HIGH
Non-HDL Cholesterol (Calc): 119 mg/dL (calc) (ref <130)
Total CHOL/HDL Ratio: 2.5 (calc) (ref <5.0)
Triglycerides: 74 mg/dL (ref <150)

## 2020-02-16 NOTE — Patient Instructions (Signed)
Continue to follow-up with Dr. Elyse Hsu for hypothyroidism and osteoporosis.  Return in 1 year or as needed.  Have x-ray of left index finger.

## 2020-02-20 ENCOUNTER — Telehealth: Payer: Self-pay | Admitting: Internal Medicine

## 2020-02-20 NOTE — Telephone Encounter (Signed)
Maria Perkins [phone]  [name] called to say she had received a denial letter from [facility] about her labs that was done on 01/31/20. The letter did not specify what they were not paying, so I advised her to call [employer] to find out. She has not received bill from [place] yet. She is going to get more details and then will call back if she needs more assistance.

## 2020-03-04 DIAGNOSIS — H524 Presbyopia: Secondary | ICD-10-CM | POA: Diagnosis not present

## 2020-06-30 DIAGNOSIS — M810 Age-related osteoporosis without current pathological fracture: Secondary | ICD-10-CM | POA: Diagnosis not present

## 2020-06-30 DIAGNOSIS — E039 Hypothyroidism, unspecified: Secondary | ICD-10-CM | POA: Diagnosis not present

## 2020-07-01 DIAGNOSIS — E039 Hypothyroidism, unspecified: Secondary | ICD-10-CM | POA: Diagnosis not present

## 2020-07-01 DIAGNOSIS — M810 Age-related osteoporosis without current pathological fracture: Secondary | ICD-10-CM | POA: Diagnosis not present

## 2020-07-06 ENCOUNTER — Encounter (INDEPENDENT_AMBULATORY_CARE_PROVIDER_SITE_OTHER): Payer: Medicare Other | Admitting: Ophthalmology

## 2020-07-06 DIAGNOSIS — L821 Other seborrheic keratosis: Secondary | ICD-10-CM | POA: Diagnosis not present

## 2020-07-06 DIAGNOSIS — D225 Melanocytic nevi of trunk: Secondary | ICD-10-CM | POA: Diagnosis not present

## 2020-07-06 DIAGNOSIS — L814 Other melanin hyperpigmentation: Secondary | ICD-10-CM | POA: Diagnosis not present

## 2020-07-29 ENCOUNTER — Encounter (INDEPENDENT_AMBULATORY_CARE_PROVIDER_SITE_OTHER): Payer: Medicare Other | Admitting: Ophthalmology

## 2020-07-29 ENCOUNTER — Other Ambulatory Visit: Payer: Self-pay

## 2020-07-29 DIAGNOSIS — H43813 Vitreous degeneration, bilateral: Secondary | ICD-10-CM

## 2020-07-29 DIAGNOSIS — H353131 Nonexudative age-related macular degeneration, bilateral, early dry stage: Secondary | ICD-10-CM | POA: Diagnosis not present

## 2020-07-29 DIAGNOSIS — H33302 Unspecified retinal break, left eye: Secondary | ICD-10-CM | POA: Diagnosis not present

## 2020-07-29 DIAGNOSIS — H2513 Age-related nuclear cataract, bilateral: Secondary | ICD-10-CM

## 2020-07-29 DIAGNOSIS — D3131 Benign neoplasm of right choroid: Secondary | ICD-10-CM

## 2020-07-29 DIAGNOSIS — H35372 Puckering of macula, left eye: Secondary | ICD-10-CM | POA: Diagnosis not present

## 2020-07-30 ENCOUNTER — Encounter (INDEPENDENT_AMBULATORY_CARE_PROVIDER_SITE_OTHER): Payer: Medicare Other | Admitting: Ophthalmology

## 2020-08-24 DIAGNOSIS — E039 Hypothyroidism, unspecified: Secondary | ICD-10-CM | POA: Diagnosis not present

## 2020-09-14 ENCOUNTER — Telehealth: Payer: Self-pay | Admitting: Internal Medicine

## 2020-09-14 DIAGNOSIS — M810 Age-related osteoporosis without current pathological fracture: Secondary | ICD-10-CM

## 2020-09-14 DIAGNOSIS — Z780 Asymptomatic menopausal state: Secondary | ICD-10-CM

## 2020-09-14 DIAGNOSIS — Z1231 Encounter for screening mammogram for malignant neoplasm of breast: Secondary | ICD-10-CM

## 2020-09-14 NOTE — Telephone Encounter (Signed)
Maria Perkins [phone]  [name] called to say when she was at Dr Legrand Como Altheimer's office a while back, he suggested she have her bone density done and be sure to have them check for compression fractures, but she would need to have that ordered thru her PCP. She is ready for that and her manogram to be ordered. She gets them done at the [facility].

## 2020-09-14 NOTE — Telephone Encounter (Signed)
Please order mammogram and [facility] through [facility] and let pt know so she can call and schedule.

## 2020-09-15 ENCOUNTER — Other Ambulatory Visit: Payer: Self-pay | Admitting: Internal Medicine

## 2020-09-15 DIAGNOSIS — Z1231 Encounter for screening mammogram for malignant neoplasm of breast: Secondary | ICD-10-CM

## 2020-12-30 ENCOUNTER — Other Ambulatory Visit: Payer: Medicare Other

## 2020-12-30 ENCOUNTER — Ambulatory Visit
Admission: RE | Admit: 2020-12-30 | Discharge: 2020-12-30 | Disposition: A | Discharge status: Home / Self Care | Payer: Medicare Other | Source: Ambulatory Visit | Attending: Internal Medicine | Admitting: Internal Medicine

## 2020-12-30 ENCOUNTER — Other Ambulatory Visit: Payer: Self-pay

## 2020-12-30 DIAGNOSIS — Z1231 Encounter for screening mammogram for malignant neoplasm of breast: Secondary | ICD-10-CM

## 2020-12-31 ENCOUNTER — Telehealth: Payer: Self-pay | Admitting: Internal Medicine

## 2020-12-31 NOTE — Telephone Encounter (Signed)
Next week

## 2020-12-31 NOTE — Telephone Encounter (Signed)
[name] [phone]  Maria Perkins called to say she has had back pain for about 9 months, she was waiting till she had her bone density per her endocrinologist and was going to have them look for compound fractures, however they called and postponed her bone density until 05/27/2021, so she can not wait until then to do something.   The pain is lower back towards right side, she would like to come and see you and start there because pain is getting worse.

## 2021-01-01 NOTE — Telephone Encounter (Signed)
scheduled

## 2021-01-04 ENCOUNTER — Other Ambulatory Visit: Payer: Self-pay

## 2021-01-04 ENCOUNTER — Ambulatory Visit
Admission: RE | Admit: 2021-01-04 | Discharge: 2021-01-04 | Disposition: A | Discharge status: Home / Self Care | Payer: Medicare Other | Source: Ambulatory Visit | Attending: Internal Medicine | Admitting: Internal Medicine

## 2021-01-04 ENCOUNTER — Ambulatory Visit (INDEPENDENT_AMBULATORY_CARE_PROVIDER_SITE_OTHER): Payer: Medicare Other | Admitting: Internal Medicine

## 2021-01-04 ENCOUNTER — Encounter: Payer: Self-pay | Admitting: Internal Medicine

## 2021-01-04 VITALS — BP 100/70 | HR 60 | Temp 98.0°F | Ht 64.0 in | Wt 117.0 lb

## 2021-01-04 DIAGNOSIS — M5440 Lumbago with sciatica, unspecified side: Secondary | ICD-10-CM | POA: Diagnosis not present

## 2021-01-04 DIAGNOSIS — M545 Low back pain, unspecified: Secondary | ICD-10-CM | POA: Diagnosis not present

## 2021-01-04 DIAGNOSIS — M810 Age-related osteoporosis without current pathological fracture: Secondary | ICD-10-CM

## 2021-01-04 DIAGNOSIS — E039 Hypothyroidism, unspecified: Secondary | ICD-10-CM

## 2021-01-04 DIAGNOSIS — M25551 Pain in right hip: Secondary | ICD-10-CM | POA: Diagnosis not present

## 2021-01-04 DIAGNOSIS — G8929 Other chronic pain: Secondary | ICD-10-CM

## 2021-01-04 MED ORDER — SULINDAC 200 MG PO TABS: 200.0000 mg | ORAL_TABLET | Freq: Two times a day (BID) | ORAL | 0 refills | Status: DC

## 2021-01-04 NOTE — Progress Notes (Signed)
   Subjective:    Patient ID: Maria Perkins, female    DOB: [birth year], 70 y.o.   MRN: [id number]  HPI  70 year old Female seen today for right sided low back pain.  This started in the Spring 2021.  She has been exercising a fair amount over the past several months.  Has been using weights in her exercise regimen.  Back pain seemed to start around the time she began to use the weight  regimen and has continued.  Had normal mammogram in February.  Bone density study has been scheduled for July.  She is frustrated she cannot get a bone density study until July.  Last bone density study done in 2019 showed a T score of -2.1 in the left femoral neck.  In the spine T score was -1.5.  She has a history of hypothyroidism treated with Synthroid 0.112 mg daily.  TSH in March 2021 was normal.  Has not really tried much in the way of anti-inflammatory medication for the pain.  We will try Clinoril 200 mg twice daily for 10 days to 2 weeks.  Since she cannot get her bone density study for a few months, we can go ahead and order x-rays of the LS spine and right hip.  Sees Dr. Elyse Hsu, endocrinologist for osteoporosis and primary hypothyroidism.  Has been seeing him since 2015.  History of GI intolerance to bisphosphonates.  Had 3 doses of Reclast in 2015, 2017, and 2018.  When she saw Dr. Elyse Hsu in August 2021, she reported at that time a 63-month history of nonspecific localized right lower back pain without spinal tenderness.  And was not felt to have a vertebral compression fracture.   Review of Systems see above-no history of falls to explain pain.  Pain radiates into her right lower extremity     Objective:   Physical Exam Blood pressure 100/70 pulse 60 regular temperature 98 degrees pulse oximetry 98% weight 117 pounds height 5 feet 4 inches BMI 20.08 Straight leg raising is negative at 90 degrees bilaterally.  Muscle strength in the lower extremities is normal.  Deep tendon reflexes 2+ and  symmetrical in the knees.  No significant pain to internal and external rotation of right lower leg.  No thyromegaly.      Assessment & Plan:  History of right back pain dating back to August 2021 at which time Dr. Altheimer's records  mentioned this same issue  in August 2021 with ongoing  6-month history at that time indicating pain likely started in May 2021.Marland Kitchen  Seems to have started when she was using some weights with vigorous exercising.  Patient indicates cannot get appointment for bone density study until July 2022.  History of osteoporosis treated with bisphosphonates and subsequently Reclast followed by Dr. Elyse Hsu.  Pain is annoying but not significantly debilitating.  Doubt she has a lumbar compression fracture but we will get LS spine films and right hip film.  History of osteoporosis-bone density study pending  Plan: Patient will try Clinoril 200 mg twice daily for musculoskeletal pain.  Recommend physical therapy at [facility].  Follow-up March 22 at which time she will have her annual Medicare wellness exam.  25 minutes spent: Reviewing records in [place], reviewing records from Dr. Elyse Hsu, reviewing bone density study from 2019, interviewing and examining patient along with medical decision making as well as E scribing prescription medication.

## 2021-01-04 NOTE — Patient Instructions (Signed)
It was a pleasure to see you today.  Sorry you are not feeling well.  Re[facility]  To hand x-ray of lumbar spine and right hip.  Try Clinoril 200 mg twice daily for musculoskeletal pain.  Follow-up here March 22 for health maintenance exam, Medicare wellness and follow-up on musculoskeletal pain that you are having at the present time.

## 2021-01-15 ENCOUNTER — Other Ambulatory Visit: Payer: Self-pay

## 2021-01-15 MED ORDER — ZOLPIDEM TARTRATE 5 MG PO TABS: ORAL_TABLET | ORAL | 1 refills | Status: DC

## 2021-01-21 DIAGNOSIS — M5459 Other low back pain: Secondary | ICD-10-CM | POA: Diagnosis not present

## 2021-01-21 DIAGNOSIS — M419 Scoliosis, unspecified: Secondary | ICD-10-CM | POA: Diagnosis not present

## 2021-01-21 DIAGNOSIS — M5416 Radiculopathy, lumbar region: Secondary | ICD-10-CM | POA: Diagnosis not present

## 2021-02-08 ENCOUNTER — Other Ambulatory Visit: Payer: Self-pay

## 2021-02-08 ENCOUNTER — Other Ambulatory Visit: Payer: Medicare Other | Admitting: Internal Medicine

## 2021-02-08 DIAGNOSIS — E7800 Pure hypercholesterolemia, unspecified: Secondary | ICD-10-CM

## 2021-02-08 DIAGNOSIS — M5459 Other low back pain: Secondary | ICD-10-CM | POA: Diagnosis not present

## 2021-02-08 DIAGNOSIS — G8929 Other chronic pain: Secondary | ICD-10-CM

## 2021-02-08 DIAGNOSIS — Z780 Asymptomatic menopausal state: Secondary | ICD-10-CM

## 2021-02-08 DIAGNOSIS — M5440 Lumbago with sciatica, unspecified side: Secondary | ICD-10-CM | POA: Diagnosis not present

## 2021-02-08 DIAGNOSIS — M419 Scoliosis, unspecified: Secondary | ICD-10-CM | POA: Diagnosis not present

## 2021-02-08 DIAGNOSIS — E039 Hypothyroidism, unspecified: Secondary | ICD-10-CM

## 2021-02-08 DIAGNOSIS — M810 Age-related osteoporosis without current pathological fracture: Secondary | ICD-10-CM

## 2021-02-08 DIAGNOSIS — Z0000 Encounter for general adult medical examination without abnormal findings: Secondary | ICD-10-CM | POA: Diagnosis not present

## 2021-02-08 DIAGNOSIS — M5416 Radiculopathy, lumbar region: Secondary | ICD-10-CM | POA: Diagnosis not present

## 2021-02-09 ENCOUNTER — Encounter: Payer: Self-pay | Admitting: Internal Medicine

## 2021-02-09 ENCOUNTER — Ambulatory Visit (INDEPENDENT_AMBULATORY_CARE_PROVIDER_SITE_OTHER): Payer: Medicare Other | Admitting: Internal Medicine

## 2021-02-09 VITALS — BP 100/60 | HR 68 | Ht 62.5 in | Wt 116.0 lb

## 2021-02-09 DIAGNOSIS — M810 Age-related osteoporosis without current pathological fracture: Secondary | ICD-10-CM | POA: Diagnosis not present

## 2021-02-09 DIAGNOSIS — G8929 Other chronic pain: Secondary | ICD-10-CM

## 2021-02-09 DIAGNOSIS — M5440 Lumbago with sciatica, unspecified side: Secondary | ICD-10-CM | POA: Diagnosis not present

## 2021-02-09 DIAGNOSIS — E039 Hypothyroidism, unspecified: Secondary | ICD-10-CM

## 2021-02-09 DIAGNOSIS — F5101 Primary insomnia: Secondary | ICD-10-CM | POA: Diagnosis not present

## 2021-02-09 DIAGNOSIS — Z0000 Encounter for general adult medical examination without abnormal findings: Secondary | ICD-10-CM | POA: Diagnosis not present

## 2021-02-09 LAB — CBC WITH DIFFERENTIAL/PLATELET
Absolute Monocytes: 360 cells/uL (ref 200–950)
Basophils Absolute: 40 cells/uL (ref 0–200)
Basophils Relative: 1 %
Eosinophils Absolute: 72 cells/uL (ref 15–500)
Eosinophils Relative: 1.8 %
HCT: 42.3 % (ref 35.0–45.0)
Hemoglobin: 14.1 g/dL (ref 11.7–15.5)
Lymphs Abs: 1448 cells/uL (ref 850–3900)
MCH: 31.1 pg (ref 27.0–33.0)
MCHC: 33.3 g/dL (ref 32.0–36.0)
MCV: 93.2 fL (ref 80.0–100.0)
MPV: 10.3 fL (ref 7.5–12.5)
Monocytes Relative: 9 %
Neutro Abs: 2080 cells/uL (ref 1500–7800)
Neutrophils Relative %: 52 %
Platelets: 208 10*3/uL (ref 140–400)
RBC: 4.54 10*6/uL (ref 3.80–5.10)
RDW: 12.4 % (ref 11.0–15.0)
Total Lymphocyte: 36.2 %
WBC: 4 10*3/uL (ref 3.8–10.8)

## 2021-02-09 LAB — COMPLETE METABOLIC PANEL WITH GFR
AG Ratio: 1.9 (calc) (ref 1.0–2.5)
ALT: 14 U/L (ref 6–29)
AST: 24 U/L (ref 10–35)
Albumin: 4.6 g/dL (ref 3.6–5.1)
Alkaline phosphatase (APISO): 62 U/L (ref 37–153)
BUN: 15 mg/dL (ref 7–25)
CO2: 28 mmol/L (ref 20–32)
Calcium: 10.4 mg/dL (ref 8.6–10.4)
Chloride: 102 mmol/L (ref 98–110)
Creat: 0.72 mg/dL (ref 0.60–0.93)
GFR, Est African American: 98 mL/min/{1.73_m2} (ref >=60)
GFR, Est Non African American: 84 mL/min/{1.73_m2} (ref >=60)
Globulin: 2.4 g/dL (calc) (ref 1.9–3.7)
Glucose, Bld: 95 mg/dL (ref 65–99)
Potassium: 4.7 mmol/L (ref 3.5–5.3)
Sodium: 140 mmol/L (ref 135–146)
Total Bilirubin: 1.5 mg/dL — ABNORMAL HIGH (ref 0.2–1.2)
Total Protein: 7 g/dL (ref 6.1–8.1)

## 2021-02-09 LAB — POCT URINALYSIS DIPSTICK
Appearance: NEGATIVE
Bilirubin, UA: NEGATIVE
Blood, UA: NEGATIVE
Glucose, UA: NEGATIVE
Ketones, UA: NEGATIVE
Leukocytes, UA: NEGATIVE
Nitrite, UA: NEGATIVE
Odor: NEGATIVE
Protein, UA: NEGATIVE
Spec Grav, UA: 1.015 (ref 1.010–1.025)
Urobilinogen, UA: 0.2 E.U./dL
pH, UA: 6.5 (ref 5.0–8.0)

## 2021-02-09 LAB — LIPID PANEL
Cholesterol: 215 mg/dL — ABNORMAL HIGH (ref <200)
HDL: 87 mg/dL (ref >=50)
LDL Cholesterol (Calc): 109 mg/dL (calc) — ABNORMAL HIGH
Non-HDL Cholesterol (Calc): 128 mg/dL (calc) (ref <130)
Total CHOL/HDL Ratio: 2.5 (calc) (ref <5.0)
Triglycerides: 93 mg/dL (ref <150)

## 2021-02-09 LAB — TSH: TSH: 1.44 mIU/L (ref 0.40–4.50)

## 2021-02-09 NOTE — Progress Notes (Signed)
Subjective:    Patient ID: Maria Perkins, female    DOB: 1951/07/13, 70 y.o.   MRN: [id number]  HPI 70 year old Female for health maintenance exam and Medicare wellness visit.  She has a history of hypothyroidism, osteoporosis and insomnia.  She takes nongeneric Synthroid by Dr. Elyse Hsu.  She sees him for follow-up on hypothyroidism.  She took Reclast in 2015 2017 in 2018.  Has been intolerant of oral bisphosphonates.  Bone density study ordered for July 2022.  Bone density study in 2019 showed T score left femoral neck -2.1 and -1.5 in the AP spine.  She was seen in February for chronic right-sided back pain.  Has been seen at [facility] PT and has improved.  Past medical history: In 1996 she was diagnosed with a cervical disc bulge C5-C6 treated with steroids and tractions.  She improved and never required surgery.  Social history: She is married.  2 adult daughters.  Husband is a former English as a second language teacher of the [employer] and teaches at [employer] in [employer] [place].  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  Family history: 2 brothers in good health.  Mother died at age 50 from complications of a stroke in 11/15/1999.  Father died with history of diabetes and dementia.   Immunizations are up-to-date.  Had colonoscopy in 2016 with 10-year follow-up recommended.  Had mammogram February 2022.   Recently had x-rays of right hip and LS-spine due to her back pain.  No compression fractures of the LS spine.  Had left convex scoliosis and L3.  Right hip radiographs were unremarkable.   Review of Systems no new complaints-exercises regularly     Objective:   Physical Exam Blood pressure 100/60 pulse 68 pulse oximetry 98% weight 116 pounds BMI 20.88  Skin: Warm and dry.  Nodes none.  TMs clear.  Neck supple.  No JVD thyromegaly or carotid bruits.  Chest is clear to auscultation without  rales or wheezing.  Cardiac exam: Regular rate and rhythm without murmurs or ectopy.  Breasts are without masses.  Abdomen: Soft nondistended without hepatosplenomegaly masses or tenderness.  Extremities without deformity or edema.  Neuro: Intact without gross focal deficits.  Affect felt judgment are normal.       Assessment & Plan:  Right back pain improving with physical therapy  History of osteoporosis with history of Reclast therapy-she will see Dr. Elyse Hsu in the near future  Hypothyroidism-followed by Dr. Elyse Hsu, Endocrinologist  History of insomnia longstanding treated with Ambien  Plan: Continue current regimen and follow-up with Endocrinologist.  Continue current medications and return in 1 year or as needed.  Continue physical therapy until pain has resolved.  Subjective:   Patient presents for Medicare Annual/Subsequent preventive examination.  Review Past Medical/Family/Social: See above   Risk Factors  Current exercise habits: Exercises regularly Dietary issues discussed: Low-fat low carbohydrate  Cardiac risk factors: Family history of stroke in mother  Depression Screen  (Note: if answer to either of the following is "Yes", a more complete depression screening is indicated)   Over the past two weeks, have you felt down, depressed or hopeless? No  Over the past two weeks, have you felt little interest or pleasure in doing things? No Have you lost interest or pleasure in daily life? No Do you often feel hopeless? No Do you cry easily over simple problems? No   Activities of  Daily Living  In your present state of health, do you have any difficulty performing the following activities?:   Driving? No  Managing money? No  Feeding yourself? No  Getting from bed to chair? No  Climbing a flight of stairs? No  Preparing food and eating?: No  Bathing or showering? No  Getting dressed: No  Getting to the toilet? No  Using the toilet:No  Moving around from  place to place: No  In the past year have you fallen or had a near fall?:No  Are you sexually active? No  Do you have more than one partner? No   Hearing Difficulties: No  Do you often ask people to speak up or repeat themselves? No  Do you experience ringing or noises in your ears? No  Do you have difficulty understanding soft or whispered voices? No  Do you feel that you have a problem with memory? No Do you often misplace items? No    Home Safety:  Do you have a smoke alarm at your residence? Yes Do you have grab bars in the bathroom?  Yes Do you have throw rugs in your house?  None   Cognitive Testing  Alert? Yes Normal Appearance?Yes  Oriented to person? Yes Place? Yes  Time? Yes  Recall of three objects? Yes  Can perform simple calculations? Yes  Displays appropriate judgment?Yes  Can read the correct time from a watch face?Yes   List the Names of Other Physician/Practitioners you currently use:  See referral list for the physicians patient is currently seeing.  Dr. Elyse Hsu   Review of Systems: See above   Objective:     General appearance: Appears younger than stated age and trim Head: Normocephalic, without obvious abnormality, atraumatic  Eyes: conj clear, EOMi PEERLA  Ears: normal TM's and external ear canals both ears  Nose: Nares normal. Septum midline. Mucosa normal. No drainage or sinus tenderness.  Throat: lips, mucosa, and tongue normal; teeth and gums normal  Neck: no adenopathy, no carotid bruit, no JVD, supple, symmetrical, trachea midline and thyroid not enlarged, symmetric, no tenderness/mass/nodules  No CVA tenderness.  Lungs: clear to auscultation bilaterally  Breasts: normal appearance, no masses or tenderness Heart: regular rate and rhythm, S1, S2 normal, no murmur, click, rub or gallop  Abdomen: soft, non-tender; bowel sounds normal; no masses, no organomegaly  Musculoskeletal: ROM normal in all joints, no crepitus, no deformity, Normal  muscle strengthen. Back  is symmetric, no curvature. Skin: Skin color, texture, turgor normal. No rashes or lesions  Lymph nodes: Cervical, supraclavicular, and axillary nodes normal.  Neurologic: CN 2 -12 Normal, Normal symmetric reflexes. Normal coordination and gait  Psych: Alert & Oriented x 3, Mood appear stable.    Assessment:    Annual wellness medicare exam   Plan:    During the course of the visit the patient was educated and counseled about appropriate screening and preventive services including:   Immunizations are up-to-date  Mammogram up-to-date  Colonoscopy up-to-date     Patient Instructions (the written plan) was given to the patient.  Medicare Attestation  I have personally reviewed:  The patient's medical and social history  Their use of alcohol, tobacco or illicit drugs  Their current medications and supplements  The patient's functional ability including ADLs,fall risks, home safety risks, cognitive, and hearing and visual impairment  Diet and physical activities  Evidence for depression or mood disorders  The patient's weight, height, BMI, and visual acuity have been recorded in the chart.  I have made referrals, counseling, and provided education to the patient based on review of the above and I have provided the patient with a written personalized care plan for preventive services.

## 2021-02-10 DIAGNOSIS — D485 Neoplasm of uncertain behavior of skin: Secondary | ICD-10-CM | POA: Diagnosis not present

## 2021-02-10 DIAGNOSIS — C44329 Squamous cell carcinoma of skin of other parts of face: Secondary | ICD-10-CM | POA: Diagnosis not present

## 2021-02-10 DIAGNOSIS — C44229 Squamous cell carcinoma of skin of left ear and external auricular canal: Secondary | ICD-10-CM | POA: Diagnosis not present

## 2021-02-10 NOTE — Patient Instructions (Addendum)
Bone density study ordered for July.  Follow-up regarding hypothyroidism with endocrinologist as well as osteoporosis.  Continue current medications.  Labs are within normal limits.  Continue PT as long as it is helpful to you.  It was a pleasure to see you today.  Return in 1 year or as needed.  Immunizations are up-to-date.

## 2021-02-22 DIAGNOSIS — M5459 Other low back pain: Secondary | ICD-10-CM | POA: Diagnosis not present

## 2021-02-22 DIAGNOSIS — M5416 Radiculopathy, lumbar region: Secondary | ICD-10-CM | POA: Diagnosis not present

## 2021-02-22 DIAGNOSIS — M419 Scoliosis, unspecified: Secondary | ICD-10-CM | POA: Diagnosis not present

## 2021-03-08 DIAGNOSIS — M5459 Other low back pain: Secondary | ICD-10-CM | POA: Diagnosis not present

## 2021-03-08 DIAGNOSIS — M419 Scoliosis, unspecified: Secondary | ICD-10-CM | POA: Diagnosis not present

## 2021-03-08 DIAGNOSIS — M5416 Radiculopathy, lumbar region: Secondary | ICD-10-CM | POA: Diagnosis not present

## 2021-03-22 DIAGNOSIS — M5459 Other low back pain: Secondary | ICD-10-CM | POA: Diagnosis not present

## 2021-03-22 DIAGNOSIS — M5416 Radiculopathy, lumbar region: Secondary | ICD-10-CM | POA: Diagnosis not present

## 2021-03-22 DIAGNOSIS — M419 Scoliosis, unspecified: Secondary | ICD-10-CM | POA: Diagnosis not present

## 2021-04-13 DIAGNOSIS — M419 Scoliosis, unspecified: Secondary | ICD-10-CM | POA: Diagnosis not present

## 2021-04-13 DIAGNOSIS — M5459 Other low back pain: Secondary | ICD-10-CM | POA: Diagnosis not present

## 2021-04-13 DIAGNOSIS — M5416 Radiculopathy, lumbar region: Secondary | ICD-10-CM | POA: Diagnosis not present

## 2021-04-14 DIAGNOSIS — H5203 Hypermetropia, bilateral: Secondary | ICD-10-CM | POA: Diagnosis not present

## 2021-04-28 ENCOUNTER — Encounter (INDEPENDENT_AMBULATORY_CARE_PROVIDER_SITE_OTHER): Payer: Medicare Other | Admitting: Ophthalmology

## 2021-05-17 DIAGNOSIS — M419 Scoliosis, unspecified: Secondary | ICD-10-CM | POA: Diagnosis not present

## 2021-05-17 DIAGNOSIS — M5459 Other low back pain: Secondary | ICD-10-CM | POA: Diagnosis not present

## 2021-05-17 DIAGNOSIS — M5416 Radiculopathy, lumbar region: Secondary | ICD-10-CM | POA: Diagnosis not present

## 2021-05-26 ENCOUNTER — Other Ambulatory Visit: Payer: Self-pay

## 2021-05-26 ENCOUNTER — Ambulatory Visit
Admission: RE | Admit: 2021-05-26 | Discharge: 2021-05-26 | Disposition: A | Discharge status: Home / Self Care | Payer: Medicare Other | Source: Ambulatory Visit | Attending: Internal Medicine | Admitting: Internal Medicine

## 2021-05-26 ENCOUNTER — Encounter (INDEPENDENT_AMBULATORY_CARE_PROVIDER_SITE_OTHER): Payer: Medicare Other | Admitting: Ophthalmology

## 2021-05-26 DIAGNOSIS — H43813 Vitreous degeneration, bilateral: Secondary | ICD-10-CM | POA: Diagnosis not present

## 2021-05-26 DIAGNOSIS — M810 Age-related osteoporosis without current pathological fracture: Secondary | ICD-10-CM | POA: Diagnosis not present

## 2021-05-26 DIAGNOSIS — D3131 Benign neoplasm of right choroid: Secondary | ICD-10-CM

## 2021-05-26 DIAGNOSIS — H35372 Puckering of macula, left eye: Secondary | ICD-10-CM | POA: Diagnosis not present

## 2021-05-26 DIAGNOSIS — Z780 Asymptomatic menopausal state: Secondary | ICD-10-CM | POA: Diagnosis not present

## 2021-05-26 DIAGNOSIS — H353131 Nonexudative age-related macular degeneration, bilateral, early dry stage: Secondary | ICD-10-CM

## 2021-05-26 DIAGNOSIS — M85852 Other specified disorders of bone density and structure, left thigh: Secondary | ICD-10-CM | POA: Diagnosis not present

## 2021-05-26 DIAGNOSIS — Z1231 Encounter for screening mammogram for malignant neoplasm of breast: Secondary | ICD-10-CM

## 2021-05-31 ENCOUNTER — Ambulatory Visit (INDEPENDENT_AMBULATORY_CARE_PROVIDER_SITE_OTHER): Payer: Medicare Other | Admitting: Internal Medicine

## 2021-05-31 ENCOUNTER — Other Ambulatory Visit: Payer: Self-pay

## 2021-05-31 ENCOUNTER — Encounter: Payer: Self-pay | Admitting: Internal Medicine

## 2021-05-31 VITALS — BP 90/60 | HR 65 | Ht 62.5 in | Wt 114.0 lb

## 2021-05-31 DIAGNOSIS — Z888 Allergy status to other drugs, medicaments and biological substances status: Secondary | ICD-10-CM | POA: Diagnosis not present

## 2021-05-31 DIAGNOSIS — M5459 Other low back pain: Secondary | ICD-10-CM | POA: Diagnosis not present

## 2021-05-31 DIAGNOSIS — M5416 Radiculopathy, lumbar region: Secondary | ICD-10-CM | POA: Diagnosis not present

## 2021-05-31 DIAGNOSIS — M419 Scoliosis, unspecified: Secondary | ICD-10-CM | POA: Diagnosis not present

## 2021-05-31 NOTE — Progress Notes (Signed)
   Subjective:    Patient ID: Maria Perkins, female    DOB: 03-Jun-1951, 70 y.o.   MRN: [id number]  HPI She is here to discuss recent bone density study. Dr. Elyse Hsu recently retired. She took Reclast for osteoporosis per Dr. Elyse Hsu, Endocrinologist in 2015, 2017, and 2018 Her Last Vitamin D level was 41 in August 2021.  Hx of right sided back pain, musculoskeletal in nature, improved with PT by [facility].  History of hypothyroidism and TSH in March 2022 was normal on Synthroid 0.112 mg daily.  Recent bone density study performed on July 6 showed T score in the AP spine at L1-L2 of -2.5 consistent with osteoporosis.  In 2019 T score at L1-L2 was -2.1.    Recent T score on bone density test in the left femoral neck was -2.2 compared to -2.1 in 2019.  Patient is a bit alarmed thinking that her osteoporosis  has gotten worse.  She has made an appointment to see Endocrinologist at [facility] in the near future which I think is very reasonable.  Also found some additional bone density reports in her paper chart and she was provided copies of the studies to take with her to the Endocrinology appointment.  Our office went on the electronic medical record in 2011.  She has had no fractures.  She is not prone to falls.  She is physically active.  Review of Systems see above     Objective:   Physical Exam Blood pressure is 90/60 pulse 65 pulse oximetry 97% weight 114 pounds BMI 20.52  Her build is slender.  She is not kyphotic.       Assessment & Plan:  History of osteoporosis treated with Reclast.  Patient has appointment to see endocrinologist at [facility] soon.  She is concerned that condition may have worsened with recent bone density report.  However she received Reclast for 3 consecutive years and has had no fractures.  No recent vitamin D level done in this office is Medicare does not cover it.  Will defer this to  Endocrinologist

## 2021-05-31 NOTE — Patient Instructions (Addendum)
We have reviewed bone density study together.  I think it is reasonable to pursue Endocrine consultation in the near future.

## 2021-07-01 ENCOUNTER — Other Ambulatory Visit: Payer: Self-pay | Admitting: Internal Medicine

## 2021-07-06 DIAGNOSIS — L570 Actinic keratosis: Secondary | ICD-10-CM | POA: Diagnosis not present

## 2021-07-06 DIAGNOSIS — D225 Melanocytic nevi of trunk: Secondary | ICD-10-CM | POA: Diagnosis not present

## 2021-07-06 DIAGNOSIS — L814 Other melanin hyperpigmentation: Secondary | ICD-10-CM | POA: Diagnosis not present

## 2021-07-06 DIAGNOSIS — L821 Other seborrheic keratosis: Secondary | ICD-10-CM | POA: Diagnosis not present

## 2021-08-19 ENCOUNTER — Telehealth: Payer: Medicare Other | Admitting: Internal Medicine

## 2021-08-19 NOTE — Telephone Encounter (Signed)
Called patient to let her know to go to Ortho and gave her the name of [facility] and [facility] that has walk-in clinics, she verbalized understanding.

## 2021-08-19 NOTE — Telephone Encounter (Signed)
[name] [phone]  Maria Perkins called to say 10 days ago she jammed her toes including the big toe, she keep them wrapped, they are better except the big toe, she is still having a lot of pain when walking and wearing shoes, she even has throbbing pain sometimes at night. Does she need to come and see you and get xray?

## 2021-09-17 DIAGNOSIS — E039 Hypothyroidism, unspecified: Secondary | ICD-10-CM | POA: Diagnosis not present

## 2021-09-17 DIAGNOSIS — M810 Age-related osteoporosis without current pathological fracture: Secondary | ICD-10-CM | POA: Diagnosis not present

## 2021-09-18 ENCOUNTER — Other Ambulatory Visit: Payer: Self-pay | Admitting: Internal Medicine

## 2021-09-20 DIAGNOSIS — Z79890 Hormone replacement therapy (postmenopausal): Secondary | ICD-10-CM | POA: Diagnosis not present

## 2021-09-20 DIAGNOSIS — M810 Age-related osteoporosis without current pathological fracture: Secondary | ICD-10-CM | POA: Diagnosis not present

## 2021-09-20 DIAGNOSIS — E039 Hypothyroidism, unspecified: Secondary | ICD-10-CM | POA: Diagnosis not present

## 2021-10-18 DIAGNOSIS — E039 Hypothyroidism, unspecified: Secondary | ICD-10-CM | POA: Diagnosis not present

## 2021-10-18 DIAGNOSIS — M810 Age-related osteoporosis without current pathological fracture: Secondary | ICD-10-CM | POA: Diagnosis not present

## 2021-12-28 ENCOUNTER — Encounter: Payer: Self-pay | Admitting: Internal Medicine

## 2022-01-31 ENCOUNTER — Other Ambulatory Visit: Payer: Self-pay | Admitting: Internal Medicine

## 2022-01-31 DIAGNOSIS — Z1231 Encounter for screening mammogram for malignant neoplasm of breast: Secondary | ICD-10-CM

## 2022-02-07 ENCOUNTER — Other Ambulatory Visit: Payer: Medicare Other

## 2022-02-07 ENCOUNTER — Other Ambulatory Visit: Payer: Self-pay

## 2022-02-07 DIAGNOSIS — Z136 Encounter for screening for cardiovascular disorders: Secondary | ICD-10-CM

## 2022-02-07 DIAGNOSIS — E039 Hypothyroidism, unspecified: Secondary | ICD-10-CM | POA: Diagnosis not present

## 2022-02-07 DIAGNOSIS — F5101 Primary insomnia: Secondary | ICD-10-CM

## 2022-02-08 LAB — COMPLETE METABOLIC PANEL WITH GFR
AG Ratio: 2 (calc) (ref 1.0–2.5)
ALT: 16 U/L (ref 6–29)
AST: 26 U/L (ref 10–35)
Albumin: 4.6 g/dL (ref 3.6–5.1)
Alkaline phosphatase (APISO): 67 U/L (ref 37–153)
BUN: 16 mg/dL (ref 7–25)
CO2: 31 mmol/L (ref 20–32)
Calcium: 10.3 mg/dL (ref 8.6–10.4)
Chloride: 103 mmol/L (ref 98–110)
Creat: 0.73 mg/dL (ref 0.60–1.00)
Globulin: 2.3 g/dL (calc) (ref 1.9–3.7)
Glucose, Bld: 97 mg/dL (ref 65–99)
Potassium: 4.8 mmol/L (ref 3.5–5.3)
Sodium: 141 mmol/L (ref 135–146)
Total Bilirubin: 1.9 mg/dL — ABNORMAL HIGH (ref 0.2–1.2)
Total Protein: 6.9 g/dL (ref 6.1–8.1)
eGFR: 87 mL/min/{1.73_m2} (ref >=60)

## 2022-02-08 LAB — CBC WITH DIFFERENTIAL/PLATELET
Absolute Monocytes: 378 cells/uL (ref 200–950)
Basophils Absolute: 31 cells/uL (ref 0–200)
Basophils Relative: 0.8 %
Eosinophils Absolute: 59 cells/uL (ref 15–500)
Eosinophils Relative: 1.5 %
HCT: 43.1 % (ref 35.0–45.0)
Hemoglobin: 14.3 g/dL (ref 11.7–15.5)
Lymphs Abs: 1381 cells/uL (ref 850–3900)
MCH: 31 pg (ref 27.0–33.0)
MCHC: 33.2 g/dL (ref 32.0–36.0)
MCV: 93.5 fL (ref 80.0–100.0)
MPV: 10.6 fL (ref 7.5–12.5)
Monocytes Relative: 9.7 %
Neutro Abs: 2051 cells/uL (ref 1500–7800)
Neutrophils Relative %: 52.6 %
Platelets: 198 10*3/uL (ref 140–400)
RBC: 4.61 10*6/uL (ref 3.80–5.10)
RDW: 12 % (ref 11.0–15.0)
Total Lymphocyte: 35.4 %
WBC: 3.9 10*3/uL (ref 3.8–10.8)

## 2022-02-08 LAB — LIPID PANEL
Cholesterol: 207 mg/dL — ABNORMAL HIGH (ref <200)
HDL: 90 mg/dL (ref >=50)
LDL Cholesterol (Calc): 101 mg/dL (calc) — ABNORMAL HIGH
Non-HDL Cholesterol (Calc): 117 mg/dL (calc) (ref <130)
Total CHOL/HDL Ratio: 2.3 (calc) (ref <5.0)
Triglycerides: 74 mg/dL (ref <150)

## 2022-02-08 LAB — TSH: TSH: 0.09 mIU/L — ABNORMAL LOW (ref 0.40–4.50)

## 2022-02-09 ENCOUNTER — Other Ambulatory Visit: Payer: Self-pay

## 2022-02-09 ENCOUNTER — Ambulatory Visit
Admission: RE | Admit: 2022-02-09 | Discharge: 2022-02-09 | Disposition: A | Discharge status: Home / Self Care | Payer: Medicare Other | Source: Ambulatory Visit | Attending: Internal Medicine | Admitting: Internal Medicine

## 2022-02-09 DIAGNOSIS — Z1231 Encounter for screening mammogram for malignant neoplasm of breast: Secondary | ICD-10-CM | POA: Diagnosis not present

## 2022-02-10 ENCOUNTER — Encounter: Payer: Self-pay | Admitting: Internal Medicine

## 2022-02-10 ENCOUNTER — Ambulatory Visit (INDEPENDENT_AMBULATORY_CARE_PROVIDER_SITE_OTHER): Payer: Medicare Other | Admitting: Internal Medicine

## 2022-02-10 VITALS — BP 90/62 | HR 76 | Temp 98.2°F | Ht 63.5 in | Wt 115.5 lb

## 2022-02-10 DIAGNOSIS — Z0000 Encounter for general adult medical examination without abnormal findings: Secondary | ICD-10-CM

## 2022-02-10 DIAGNOSIS — N898 Other specified noninflammatory disorders of vagina: Secondary | ICD-10-CM

## 2022-02-10 DIAGNOSIS — E039 Hypothyroidism, unspecified: Secondary | ICD-10-CM | POA: Diagnosis not present

## 2022-02-10 DIAGNOSIS — F5101 Primary insomnia: Secondary | ICD-10-CM

## 2022-02-10 DIAGNOSIS — M810 Age-related osteoporosis without current pathological fracture: Secondary | ICD-10-CM | POA: Diagnosis not present

## 2022-02-10 DIAGNOSIS — Z780 Asymptomatic menopausal state: Secondary | ICD-10-CM

## 2022-02-10 LAB — POCT URINALYSIS DIPSTICK
Bilirubin, UA: NEGATIVE
Blood, UA: NEGATIVE
Glucose, UA: NEGATIVE
Ketones, UA: NEGATIVE
Leukocytes, UA: NEGATIVE
Nitrite, UA: NEGATIVE
Protein, UA: NEGATIVE
Spec Grav, UA: 1.015 (ref 1.010–1.025)
Urobilinogen, UA: 0.2 E.U./dL
pH, UA: 5 (ref 5.0–8.0)

## 2022-02-10 NOTE — Progress Notes (Addendum)
? ? ? ?Annual Wellness Visit ? ?  ? ?Patient: Maria Perkins, Female    DOB: [birth year], 71 y.o.   MRN: [id number] ?Visit Date: 02/10/2022 ?Subjective  ?  ?Maria Perkins is a 71 y.o. Female who presents today for her Annual Wellness Visit. ? ?HPI She is also here for health maintenance exam and evaluation of medical issues.  She has a history of hypothyroidism, osteoporosis and insomnia.  She is being seen by Dr. Denton Lank at [facility] and treated with thyroid replacement medication.  Was last seen in October 2022.  Currently on levothyroxine 0.112 mg daily. ? ?Has been intolerant of oral bisphosphonates.  Took Reclast in 2015, 2017 and 2018.  Bone density study in 2022 showed T score in the LS spine of -2.5.  T score in the left femoral neck was -2.2.  Vitamin D level was 36 in October 2022 when checked by Endocrinology ? ?She took bisphosphonates in the 1990s but developed peptic ulcer disease.  She did Reclast for 3 years from 2015-2017.  No falls or fractures.  Recent serum calcium 10.5.  He is planning to check PTH given high normal calcium level.  She does take vitamin D supplement. ? ?Longstanding history of insomnia treated with Ambien. ? ?Also takes Clinoril 200 mg as needed for musculoskeletal pain. ? ?She is expecting a grandchild and will be traveling to [place] with her husband for that event. ? ?She smoked for couple of years in her late 39s.  Social alcohol consumption. ? ?Social history: Married.  Husband is a former English as a second language teacher of the [employer] and teaches at [employer].  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  2 adult daughters. ? ?Family history: 2 brothers.  Mother died at age 65 from complications of a stroke that occurred 1999-11-27.  Father died with history of diabetes and dementia. ? ?Tetanus immunization is up-to-date.  Has had Shingrix vaccine.  Has had flu  vaccine. ? ? ? ? ? ? ? ?Patient Care Team: ?Elby Showers, MD as PCP - General (Internal Medicine) ? ?Review of Systems she exercises regularly by walking.  She is having issues with vaginal dryness and urinary concerns.  Recommend that she contact GYN office where she has been seen previously. ? ? Objective  ?  ?Vitals:  ? ?Physical Exam blood pressure 90/62 but she is asymptomatic with regard to low blood pressure, pulse 76 regular temperature 98.2 degrees pulse oximetry 99% weight 115 pounds 8 ounces BMI 20.14 ? ?Skin: Warm and dry.  No cervical adenopathy or thyromegaly.  No carotid bruits.  Chest is clear to auscultation without rales or wheezing.  Cardiac exam: Regular rate and rhythm without ectopy.  Abdomen soft nondistended without hepatosplenomegaly masses or tenderness.  No lower extremity pitting edema.  Neuro is intact without gross focal deficits.  Affect thought and judgment are normal. ? ? ?Most recent functional status assessment: ? ?  02/10/2022  ? 10:03 AM  ?In your present state of health, do you have any difficulty performing the following activities:  ?Hearing? 0  ?Vision? 0  ?Difficulty concentrating or making decisions? 0  ?Walking or climbing stairs? 0  ?Dressing or bathing? 0  ?Doing errands, shopping? 0  ?Preparing Food and eating ? N  ?Using the Toilet? N  ?In the past six months, have you accidently leaked urine? Y  ?Do you have problems with  loss of bowel control? N  ?Managing your Medications? N  ?Managing your Finances? N  ?Housekeeping or managing your Housekeeping? N  ? ?Most recent fall risk assessment: ? ?  02/10/2022  ? 10:01 AM  ?Fall Risk   ?Falls in the past year? 0  ?Number falls in past yr: 0  ?Injury with Fall? 0  ?Risk for fall due to : No Fall Risks  ?Follow up Falls evaluation completed  ? ? Most recent depression screenings: ? ?  02/10/2022  ? 10:02 AM 02/09/2021  ? 10:08 AM  ?PHQ 2/9 Scores  ?PHQ - 2 Score 0 0  ? ?Most recent cognitive screening: ? ?  02/10/2022  ? 10:04  AM  ?6CIT Screen  ?What Year? 0 points  ?What month? 0 points  ?What time? 0 points  ?Count back from 20 0 points  ?Months in reverse 0 points  ?Repeat phrase 0 points  ?Total Score 0 points  ? ? ? ? ? Assessment & Plan  ? ?Hypothyroidism followed by Dr. Denton Lank at [facility] ? ?Osteoporosis-intolerant of bisphosphonates-treated by Dr. Aggie Hacker at [facility] ? ?History of insomnia treated with Ambien ? ?Complains of vaginal dryness and urinary urgency-patient will contact GYN for evaluation. ? ?Immunizations reviewed ? ?Had colonoscopy by Dr. Olevia Perches in 2016 with 10-year follow-up recommended ? ? ? ?  ? ?Annual wellness visit done today including the all of the following: ?Reviewed patient's Family Medical History ?Reviewed and updated list of patient's medical providers ?Assessment of cognitive impairment was done ?Assessed patient's functional ability ?Established a written schedule for health screening services ?Health Risk Assessent Completed and Reviewed ? ?Discussed health benefits of physical activity, and encouraged her to engage in regular exercise appropriate for her age and condition.  ?  ? ? ?  ? ?I, Elby Showers, MD, have reviewed all documentation for this visit. The documentation on 02/10/22 for the exam, diagnosis, procedures, and orders are all accurate and complete. ? ? ?Angus Seller, CMA  ?

## 2022-02-10 NOTE — Patient Instructions (Signed)
Patient will contact GYN about vaginal dryness and urinary concerns. Continue follow up with Endocrinology. RTC in one year or as needed. ?

## 2022-03-21 DIAGNOSIS — Z79890 Hormone replacement therapy (postmenopausal): Secondary | ICD-10-CM | POA: Diagnosis not present

## 2022-03-21 DIAGNOSIS — E039 Hypothyroidism, unspecified: Secondary | ICD-10-CM | POA: Diagnosis not present

## 2022-03-21 DIAGNOSIS — M810 Age-related osteoporosis without current pathological fracture: Secondary | ICD-10-CM | POA: Diagnosis not present

## 2022-04-07 ENCOUNTER — Ambulatory Visit (INDEPENDENT_AMBULATORY_CARE_PROVIDER_SITE_OTHER): Payer: Medicare Other | Admitting: Obstetrics & Gynecology

## 2022-04-07 ENCOUNTER — Encounter (HOSPITAL_BASED_OUTPATIENT_CLINIC_OR_DEPARTMENT_OTHER): Payer: Self-pay | Admitting: Obstetrics & Gynecology

## 2022-04-07 VITALS — BP 109/70 | HR 72 | Ht 63.5 in | Wt 115.4 lb

## 2022-04-07 DIAGNOSIS — N952 Postmenopausal atrophic vaginitis: Secondary | ICD-10-CM

## 2022-04-07 DIAGNOSIS — N393 Stress incontinence (female) (male): Secondary | ICD-10-CM | POA: Diagnosis not present

## 2022-04-07 DIAGNOSIS — N3942 Incontinence without sensory awareness: Secondary | ICD-10-CM | POA: Diagnosis not present

## 2022-04-07 MED ORDER — ESTRADIOL 0.1 MG/GM VA CREA: TOPICAL_CREAM | VAGINAL | 2 refills | Status: DC

## 2022-04-07 NOTE — Progress Notes (Signed)
GYNECOLOGY  VISIT  CC:   urinary concerns  HPI: 71 y.o. G2P2 Married White or Caucasian female here for complaint of urinary concerns including incontinence.  Does have some spontaneous urinary leakage.  This is starting to make her insecure.  This is not frequent but bothersome.  She does have leaking with cough or sneeze.  When she does have urinary urgency, she can typically get to the bathroom.  She gets up about once nightly to void.  Typically cannot go back to sleep unless she actually gets up and voids.  Denies vaginal bleeding.  Does have vaginal dryness and has experienced this for year.     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   Osteoporosis 11/21/2015   Serum total bilirubin elevated 11/21/2015   Back pain 05/16/2013   Insomnia 04/19/2012   Vaginal dryness 04/19/2012   Hypothyroidism 10/17/2011    Past Medical History:  Diagnosis Date   Cervical disc disease    c5-c6-not recent    Fibrocystic breast disease in female    History of migraine headaches    Insomnia    Osteopenia    Thyroid disease    hypothyroidism    Past Surgical History:  Procedure Laterality Date   COLONOSCOPY     meniscal tear     rt knee dr duda   POLYPECTOMY     UPPER GASTROINTESTINAL ENDOSCOPY      MEDS:   Current Outpatient Medications on File Prior to Visit  Medication Sig Dispense Refill   calcium-vitamin D (OSCAL WITH D) 500-5 MG-MCG tablet Take 1 tablet by mouth.     Multiple Vitamin (MULTIVITAMIN) tablet Take 1 tablet by mouth daily.     Multiple Vitamins-Minerals (PRESERVISION AREDS PO) Take by mouth.     sulindac (CLINORIL) 200 MG tablet Take 1 tablet (200 mg total) by mouth 2 (two) times daily. (Patient taking differently: Take 200 mg by mouth as needed.) 60 tablet 0   SYNTHROID 112 MCG tablet TAKE 1 TABLET ONCE DAILY BEFORE BREAKFAST. 30 tablet 0   VITAMIN D PO Take by mouth.     zolpidem (AMBIEN) 5 MG tablet TAKE ONE  TABLET BY MOUTH AT BEDTIME AS NEEDED FOR `SLEEP. 90 tablet 1   No current facility-administered medications on file prior to visit.    ALLERGIES: Patient has no known allergies.  Family History  Problem Relation Age of Onset   Stroke Mother    Colon polyps Mother    Diabetes Father    Mental illness Father    Colon polyps Brother    Colon cancer Neg Hx    Esophageal cancer Neg Hx    Rectal cancer Neg Hx    Stomach cancer Neg Hx     SH:  married, non smoker  Review of Systems  Genitourinary:        Urinary incontinence  All other systems reviewed and are negative.  PHYSICAL EXAMINATION:    BP 109/70 (BP Location: Right Arm, Patient Position: Sitting, Cuff Size: Normal)   Pulse 72   Ht 5' 3.5" (1.613 m)   Wt 115 lb 6.4 oz (52.3 kg)   BMI 20.12 kg/m     General appearance: alert, cooperative and appears stated age Lymph:  no inguinal LAD noted  Pelvic: External genitalia:  no lesions              Urethra:  normal appearing urethra with no masses, tenderness or lesions  Bartholins and Skenes: normal                 Vagina: atrophic vaginal tissue noted, no lesions              Cervix: no lesions              Bimanual Exam:  Uterus:  normal size, contour, position, consistency, mobility, non-tender              Adnexa: no mass, fullness, tenderness  Chaperone, Octaviano Batty, CMA, was present for exam.  Assessment/Plan: 1. Urinary incontinence without sensory awareness - pt can do a Kegel maneuver but does use abdominal muscles to perform this maneuver.  Feel she would benefit from PT assessment and some guidance to help her be more aware of her bladder and pelvic floor.  Hopefully this would help prevent needing any more invasive assessment or treatment - Ambulatory referral to Physical Therapy  2. Stress incontinence  3. Vaginal atrophy - estradiol (ESTRACE) 0.1 MG/GM vaginal cream; Place 1 gram vaginally twice weekly or apply small externally 3-4 times  weekly  Dispense: 42.5 g; Refill: 2

## 2022-05-04 ENCOUNTER — Ambulatory Visit: Payer: Self-pay

## 2022-05-12 ENCOUNTER — Other Ambulatory Visit: Payer: Self-pay

## 2022-05-12 ENCOUNTER — Ambulatory Visit: Payer: Medicare Other | Attending: Obstetrics & Gynecology

## 2022-05-12 DIAGNOSIS — N3942 Incontinence without sensory awareness: Secondary | ICD-10-CM | POA: Diagnosis not present

## 2022-05-12 DIAGNOSIS — R293 Abnormal posture: Secondary | ICD-10-CM | POA: Diagnosis not present

## 2022-05-12 DIAGNOSIS — M6281 Muscle weakness (generalized): Secondary | ICD-10-CM | POA: Diagnosis not present

## 2022-05-12 NOTE — Therapy (Signed)
OUTPATIENT PHYSICAL THERAPY FEMALE PELVIC EVALUATION   Patient Name: Maria Perkins MRN: [id number] DOB:1951/07/27, 72 y.o., female Today's Date: 05/12/2022   PT End of Session - 05/12/22 1331     Visit Number 1    Date for PT Re-Evaluation 07/07/22    Authorization Type Medicare    PT Start Time 1015    PT Stop Time 1055    PT Time Calculation (min) 40 min    Activity Tolerance Patient tolerated treatment well    Behavior During Therapy [facility] for tasks assessed/performed             Past Medical History:  Diagnosis Date   Cervical disc disease    c5-c6-not recent    Fibrocystic breast disease in female    History of migraine headaches    Insomnia    Osteopenia    Thyroid disease    hypothyroidism   Past Surgical History:  Procedure Laterality Date   COLONOSCOPY     meniscal tear     rt knee dr duda   POLYPECTOMY     UPPER GASTROINTESTINAL ENDOSCOPY     Patient Active Problem List   Diagnosis Date Noted   Vaginal atrophy 04/07/2022   Stress incontinence 04/07/2022   Osteoporosis, postmenopausal 12/08/2016   Serum total bilirubin elevated 11/21/2015   Back pain 05/16/2013   Insomnia 04/19/2012   Hypothyroidism 10/17/2011    PCP: Elby Showers, MD  REFERRING PROVIDER: Megan Salon, MD  REFERRING DIAG: [id number] (ICD-10-CM) - Urinary incontinence without sensory awareness  THERAPY DIAG:  Abnormal posture  Muscle weakness (generalized)  Rationale for Evaluation and Treatment Rehabilitation  ONSET DATE: 1 year ago  SUBJECTIVE:                                                                                                                                                                                           SUBJECTIVE STATEMENT: Patient states that she is having issues with urinary incontinence and it is usually unexpected. She feels like she cannot do anything to stop the leaking once it starts. She has started taking localized estrogen cream and  she feels like it is already helping.   Fluid intake: Yes: coffee in the morning; she reports hard time trying to drink water     PAIN:  Are you having pain? No   PRECAUTIONS: None  WEIGHT BEARING RESTRICTIONS No  FALLS:  Has patient fallen in last 6 months? 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  PLOF: Independent  PATIENT GOALS To regain control of body and avoid  more extreme intervention in the future to stop leaking  PERTINENT HISTORY:  2 vaginal deliveries Sexual abuse: No  BOWEL MOVEMENT Pain with bowel movement: No Type of bowel movement:Frequency 1x/day and Strain No Fully empty rectum: Yes: - Leakage: No Pads: No Fiber supplement: No  URINATION Pain with urination: No Fully empty bladder: Yes: has to think about it and gently pushes down to empty Stream:  all different directions Urgency: Yes: but she feels like she can control this with trying to activate pelvic floor - can usually make herself wait Frequency: 2-3 hours, 1x/night Leakage: Coughing, Sneezing, Laughing, and spontaneous leaking Pads: Yes: mostly when traveling or when she is in situations where it will be harder to get to the bathroom   INTERCOURSE Pain with intercourse:  Not currently sexually active - was painful in the past after menopause Ability to have vaginal penetration:  No Climax: infrequent, but non painful, briefer than it used to be   PREGNANCY Vaginal deliveries 2 Tearing Yes: none with first, minor tear with second C-section deliveries 0 Currently pregnant No  PROLAPSE None    OBJECTIVE:   COGNITION:  Overall cognitive status: Within functional limits for tasks assessed     SENSATION:  Light touch: Appears intact  Proprioception: Appears intact  GAIT:  Comments: WNL               POSTURE: posterior pelvic tilt     PALPATION:   General  2 finger width diastasis  recti at umbilicus and down to pubic symphysis, no distortion with head lift                External Perineal Exam evidence of decreased estrogen                             Internal Pelvic Floor Mild tenderness surrounding urethra bil reported as stinging  Patient confirms identification and approves PT to assess internal pelvic floor and treatment Yes  PELVIC MMT: Pelvic floor strength 2/5 Endurance 2 seconds Repeat contractions 7x with good coordination       TONE: WNL  PROLAPSE: WNL observed externally in supine  TODAY'S TREATMENT 05/12/22 EVAL  Neuromuscular re-education: Pelvic floor contraction training: Quick flicks 16X Long holds 5 x 10 seconds     PATIENT EDUCATION:  Education details: Initial HEP Person educated: Patient Education method: Consulting civil engineer, Demonstration, Tactile cues, Verbal cues, and Handouts Education comprehension: verbalized understanding   HOME EXERCISE PROGRAM: [id number]  ASSESSMENT:  CLINICAL IMPRESSION: Patient is a 71 y.o. female who was seen today for physical therapy evaluation and treatment for urinary incontinence. Exam findings notable for diastasis recti 2 finger width separation, decreased pelvic floor strength 2/5, endurance 2 seconds, and repeat contractions 7x. Signs and symptoms are most consistent with pelvic floor weakness/decreased endurance and poor bladder/hydration habits; believe she will do very well and make quick progress with addressing these issues. Initial treatment included pelvic floor contraction training and initial HEP. She will benefit from skilled PT intervention in order to decrease urinary incontinence, incorporate pelvic floor strengthening into functional strengthening program, and improve QOL.    OBJECTIVE IMPAIRMENTS decreased activity tolerance, decreased coordination, decreased endurance, increased muscle spasms, postural dysfunction, and pain.   ACTIVITY LIMITATIONS continence  PARTICIPATION  LIMITATIONS: interpersonal relationship and community activity  PERSONAL FACTORS 1 comorbidity: 2 vaginal deliveries  are also affecting patient's functional outcome.   REHAB POTENTIAL: Good  CLINICAL DECISION MAKING: Stable/uncomplicated  EVALUATION  COMPLEXITY: Low   GOALS: Goals reviewed with patient? Yes  SHORT TERM GOALS: Target date: 06/09/2022  Pt will be independent with HEP.   Baseline: Goal status: INITIAL  2.  Pt will be independent with the knack, urge suppression technique, and double voiding in order to improve bladder habits and decrease urinary incontinence.   Baseline:  Goal status: INITIAL  3.  Patient will increase water intake to at least 50oz of water a day in order to improve bladder health.  Baseline:  Goal status: INITIAL   LONG TERM GOALS: Target date: 07/07/2022   Pt will be independent with advanced HEP.   Baseline:  Goal status: INITIAL  2.  Pt will demonstrate normal pelvic floor muscle tone and A/ROM, able to achieve 4/5 strength with contractions and 10 sec endurance, in order to provide appropriate lumbopelvic support in functional activities.   Baseline:  Goal status: INITIAL  3.  Pt will report no episodes of urinary incontinence in order to improve confidence in community activities and personal hygiene.   Baseline:  Goal status: INITIAL  PLAN: PT FREQUENCY: 1x/week  PT DURATION: 8 weeks  PLANNED INTERVENTIONS: Therapeutic exercises, Therapeutic activity, Neuromuscular re-education, Balance training, Gait training, Patient/Family education, Joint mobilization, Dry Needling, Biofeedback, and Manual therapy  PLAN FOR NEXT SESSION: Bladder retraining, hydration, core training, good toileting mechanics.    Heather Roberts, PT, DPT06/22/231:32 PM

## 2022-05-23 ENCOUNTER — Ambulatory Visit: Payer: Medicare Other | Attending: Obstetrics & Gynecology

## 2022-05-23 DIAGNOSIS — R293 Abnormal posture: Secondary | ICD-10-CM | POA: Diagnosis not present

## 2022-05-23 DIAGNOSIS — M6281 Muscle weakness (generalized): Secondary | ICD-10-CM | POA: Insufficient documentation

## 2022-05-23 NOTE — Therapy (Signed)
OUTPATIENT PHYSICAL THERAPY TREATMENT NOTE   Patient Name: Maria Perkins MRN: [id number] DOB:[birth year], 71 y.o., female Today's Date: 05/23/2022  PCP: Elby Showers, MD REFERRING PROVIDER: Megan Salon, MD  END OF SESSION:   PT End of Session - 05/23/22 1442     Visit Number 2    Date for PT Re-Evaluation 07/07/22    Authorization Type Medicare    Progress Note Due on Visit 10    PT Start Time 1445    PT Stop Time 1525    PT Time Calculation (min) 40 min    Activity Tolerance Patient tolerated treatment well    Behavior During Therapy WFL for tasks assessed/performed             Past Medical History:  Diagnosis Date   Cervical disc disease    c5-c6-not recent    Fibrocystic breast disease in female    History of migraine headaches    Insomnia    Osteopenia    Thyroid disease    hypothyroidism   Past Surgical History:  Procedure Laterality Date   COLONOSCOPY     meniscal tear     rt knee dr duda   POLYPECTOMY     UPPER GASTROINTESTINAL ENDOSCOPY     Patient Active Problem List   Diagnosis Date Noted   Vaginal atrophy 04/07/2022   Stress incontinence 04/07/2022   Osteoporosis, 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 She feels like using the estrogen cream is very helpful. She has seen great improvement in not waking up at night to urinate.   PAIN:  Are you having pain? No   SUBJECTIVE 05/12/22:                                                                                                                                                                                             SUBJECTIVE STATEMENT: Patient states that she is having issues with urinary incontinence and it is usually unexpected. She feels like she cannot do anything to stop the leaking once it starts. She has started taking localized estrogen cream and she feels like it is already helping.    Fluid intake: Yes: coffee in the morning; she reports hard time trying to drink water  PAIN:  Are you having pain? No     PATIENT GOALS To regain control of body and avoid more extreme intervention in the future to stop leaking     BOWEL MOVEMENT Pain with bowel movement: No Type of bowel movement:Frequency 1x/day and Strain No Fully empty rectum: Yes: - Leakage: No Pads: No Fiber supplement: No   URINATION Pain with urination: No Fully empty bladder: Yes: has to think about it and gently pushes down to empty Stream:  all different directions Urgency: Yes: but she feels like she can control this with trying to activate pelvic floor - can usually make herself wait Frequency: 2-3 hours, 1x/night Leakage: Coughing, Sneezing, Laughing, and spontaneous leaking Pads: Yes: mostly when traveling or when she is in situations where it will be harder to get to the bathroom    INTERCOURSE Pain with intercourse:  Not currently sexually active - was painful in the past after menopause Ability to have vaginal penetration:  No Climax: infrequent, but non painful, briefer than it used to be     PREGNANCY Vaginal deliveries 2 Tearing Yes: none with first, minor tear with second C-section deliveries 0 Currently pregnant No   PROLAPSE None       OBJECTIVE 05/12/22:    COGNITION:            Overall cognitive status: Within functional limits for tasks assessed                          SENSATION:            Light touch: Appears intact            Proprioception: Appears intact   GAIT:   Comments: WNL                POSTURE:  posterior pelvic tilt        PALPATION:   General  2 finger width diastasis recti at umbilicus and down to pubic symphysis, no distortion with head lift                 External Perineal Exam evidence of decreased estrogen                             Internal Pelvic Floor Mild tenderness surrounding urethra bil reported as stinging   Patient confirms identification and approves PT to assess internal pelvic floor and treatment Yes   PELVIC MMT: Pelvic floor strength 2/5 Endurance 2 seconds Repeat contractions 7x with good coordination       TONE: WNL   PROLAPSE: WNL observed externally in supine   TODAY'S TREATMENT 05/23/22 Neuromuscular re-education: Core retraining:  Transversus abdominus training with multimodal cues for improved motor control and breath coordination Core facilitation: Supine march 2 x 10 Pelvic floor contraction training: Quick flicks in seated 76H Quick flicks in standing: 20N regular stance 10x wide stance 10x tandem stance bil    TREATMENT 05/12/22 EVAL  Neuromuscular re-education: Pelvic floor contraction training: Quick flicks 47S Long holds 5 x 10 seconds         PATIENT EDUCATION:  Education details: Initial HEP Person educated: Patient Education method: Explanation, Demonstration, Tactile cues, Verbal cues, and Handouts Education comprehension: verbalized understanding     HOME EXERCISE PROGRAM: [id number]   ASSESSMENT:   CLINICAL IMPRESSION: Patient already seeing some improvements in bladder dysfunction with return to pelvic floor strengthening. She did very well  with progressing pelvic floor to standing positions with good awareness of other muscle group co-contraction and good motor control. Excellent response to core training and progressions with good facilitation of transversus abdominus without breath holding or superficial bracing patterns. She will benefit from skilled PT intervention in order to decrease urinary  incontinence, incorporate pelvic floor strengthening into functional strengthening program, and improve QOL.      OBJECTIVE IMPAIRMENTS decreased activity tolerance, decreased coordination, decreased endurance, increased muscle spasms, postural dysfunction, and pain.    ACTIVITY LIMITATIONS continence   PARTICIPATION LIMITATIONS: interpersonal relationship and community activity   PERSONAL FACTORS 1 comorbidity: 2 vaginal deliveries  are also affecting patient's functional outcome.    REHAB POTENTIAL: Good   CLINICAL DECISION MAKING: Stable/uncomplicated   EVALUATION COMPLEXITY: Low     GOALS: Goals reviewed with patient? Yes   SHORT TERM GOALS: Target date: 06/09/2022   Pt will be independent with HEP.    Baseline: Goal status: INITIAL   2.  Pt will be independent with the knack, urge suppression technique, and double voiding in order to improve bladder habits and decrease urinary incontinence.    Baseline:  Goal status: INITIAL   3.  Patient will increase water intake to at least 50oz of water a day in order to improve bladder health.  Baseline:  Goal status: INITIAL     LONG TERM GOALS: Target date: 07/07/2022    Pt will be independent with advanced HEP.    Baseline:  Goal status: INITIAL   2.  Pt will demonstrate normal pelvic floor muscle tone and A/ROM, able to achieve 4/5 strength with contractions and 10 sec endurance, in order to provide appropriate lumbopelvic support in functional activities.    Baseline:  Goal status: INITIAL   3.  Pt will report no episodes of urinary incontinence in order to improve confidence in community activities and personal hygiene.    Baseline:  Goal status: INITIAL   PLAN: PT FREQUENCY: 1x/week   PT DURATION: 8 weeks   PLANNED INTERVENTIONS: Therapeutic exercises, Therapeutic activity, Neuromuscular re-education, Balance training, Gait training, Patient/Family education, Joint mobilization, Dry Needling, Biofeedback, and  Manual therapy   PLAN FOR NEXT SESSION: hold off on bladder retraining for now - assess improvements; progress core/pelvic floor functional strengthening.    Heather Roberts, PT, DPT07/03/233:26 PM

## 2022-05-31 ENCOUNTER — Ambulatory Visit: Payer: Medicare Other

## 2022-05-31 DIAGNOSIS — M6281 Muscle weakness (generalized): Secondary | ICD-10-CM | POA: Diagnosis not present

## 2022-05-31 DIAGNOSIS — R293 Abnormal posture: Secondary | ICD-10-CM

## 2022-05-31 NOTE — Therapy (Signed)
OUTPATIENT PHYSICAL THERAPY TREATMENT NOTE   Patient Name: Maria Perkins MRN: [id number] DOB:1951/06/26, 71 y.o., female Today's Date: 05/31/2022  PCP: Elby Showers, MD REFERRING PROVIDER: Megan Salon, MD  END OF SESSION:   PT End of Session - 05/31/22 1449     Visit Number 3    Date for PT Re-Evaluation 07/07/22    Authorization Type Medicare    Progress Note Due on Visit 10    PT Start Time 0248    PT Stop Time 0326    PT Time Calculation (min) 38 min    Activity Tolerance Patient tolerated treatment well    Behavior During Therapy [facility] for tasks assessed/performed              Past Medical History:  Diagnosis Date   Cervical disc disease    c5-c6-not recent    Fibrocystic breast disease in female    History of migraine headaches    Insomnia    Osteopenia    Thyroid disease    hypothyroidism   Past Surgical History:  Procedure Laterality Date   COLONOSCOPY     meniscal tear     rt knee dr duda   POLYPECTOMY     UPPER GASTROINTESTINAL ENDOSCOPY     Patient Active Problem List   Diagnosis Date Noted   Vaginal atrophy 04/07/2022   Stress incontinence 04/07/2022   Osteoporosis, 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 She feels like using the estrogen cream is very helpful. She has seen great improvement in not waking up at night to urinate.   PAIN:  Are you having pain? No   SUBJECTIVE 05/12/22:                                                                                                                                                                                             SUBJECTIVE STATEMENT: Patient states that she is having issues with urinary incontinence and it is usually unexpected. She feels like she cannot do anything to stop the leaking once it starts. She has started taking localized estrogen cream and she feels like it is already helping.    Fluid intake: Yes: coffee in the morning; she reports hard time trying to drink  water       PAIN:  Are you having pain? No     PATIENT GOALS To regain control of body and avoid more extreme intervention in the future to stop leaking     BOWEL MOVEMENT Pain with bowel movement: No Type of bowel movement:Frequency 1x/day and Strain No Fully empty rectum: Yes: - Leakage: No Pads: No Fiber supplement: No   URINATION Pain with urination: No Fully empty bladder: Yes: has to think about it and gently pushes down to empty Stream:  all different directions Urgency: Yes: but she feels like she can control this with trying to activate pelvic floor - can usually make herself wait Frequency: 2-3 hours, 1x/night Leakage: Coughing, Sneezing, Laughing, and spontaneous leaking Pads: Yes: mostly when traveling or when she is in situations where it will be harder to get to the bathroom    INTERCOURSE Pain with intercourse:  Not currently sexually active - was painful in the past after menopause Ability to have vaginal penetration:  No Climax: infrequent, but non painful, briefer than it used to be     PREGNANCY Vaginal deliveries 2 Tearing Yes: none with first, minor tear with second C-section deliveries 0 Currently pregnant No   PROLAPSE None       OBJECTIVE 05/12/22:    COGNITION:            Overall cognitive status: Within functional limits for tasks assessed                          SENSATION:            Light touch: Appears intact            Proprioception: Appears intact   GAIT:   Comments: WNL                POSTURE:  posterior pelvic tilt        PALPATION:   General  2 finger width diastasis recti at umbilicus and down to pubic symphysis, no distortion with head lift                 External Perineal Exam evidence of decreased estrogen                             Internal Pelvic Floor Mild tenderness surrounding urethra bil reported as stinging   Patient confirms identification and approves PT to assess internal pelvic floor and treatment Yes   PELVIC MMT: Pelvic floor strength 2/5 Endurance 2 seconds Repeat contractions 7x with good coordination       TONE: WNL   PROLAPSE: WNL observed externally in supine   TODAY'S TREATMENT 05/31/22 Neuromuscular re-education: Core facilitation: Leg extensions 10x bil Deadbug 2 x 10 Bird dog 2 x 10 Bear crawl hovers 10 x 5 sec Bridge march 10x Exercises: Strengthening: Squats (used green band initially to help improve core facilitation) 2 x 10 Push-ups (full) 2 x 5 Bil UE with green band, 2 x 10 Pallof press 10x bil, green band   TREATMENT 05/23/22 Neuromuscular re-education: Core retraining:  Transversus abdominus training with multimodal cues for improved motor control and breath coordination Core facilitation: Supine march 2 x 10 Pelvic floor contraction training: Quick flicks in seated 37T Quick flicks in standing: 06Y regular stance 10x wide stance 10x tandem stance bil    TREATMENT 05/12/22 EVAL  Neuromuscular re-education: Pelvic floor contraction training: Quick flicks  10x Long holds 5 x 10 seconds         PATIENT EDUCATION:  Education details: Exercise progressions  Person educated: Patient Education method: Explanation, Demonstration, Tactile cues, Verbal cues, and Handouts Education comprehension: verbalized understanding     HOME EXERCISE PROGRAM: [id number]   ASSESSMENT:   CLINICAL IMPRESSION: Patient doing very well overall, but still feeling like she is having trouble with knowing if she is getting good  pelvic floor contractions in standing. Believe based upon form and breath, she is getting appropriate contraction. We discussed importance of now incorporating pelvic floor and core with breath into functional strengthening activities. She did very well with all functional progressions with good understanding of how to facilitate core/pelvic floor into exercises. HEP updated so she has good exercises to work on over next few weeks until she can get back into PT from her travels. She will continue to benefit from skilled PT intervention in order to decrease urinary incontinence, incorporate pelvic floor strengthening into functional strengthening program, and improve QOL.      OBJECTIVE IMPAIRMENTS decreased activity tolerance, decreased coordination, decreased endurance, increased muscle spasms, postural dysfunction, and pain.    ACTIVITY LIMITATIONS continence   PARTICIPATION LIMITATIONS: interpersonal relationship and community activity   PERSONAL FACTORS 1 comorbidity: 2 vaginal deliveries  are also affecting patient's functional outcome.    REHAB POTENTIAL: Good   CLINICAL DECISION MAKING: Stable/uncomplicated   EVALUATION COMPLEXITY: Low     GOALS: Goals reviewed with patient? Yes   SHORT TERM GOALS: Target date: 06/09/2022   Pt will be independent with HEP.    Baseline: Goal status: INITIAL   2.  Pt will be independent with the knack, urge suppression technique, and double voiding in order to improve bladder habits and decrease urinary incontinence.    Baseline:  Goal status: INITIAL   3.  Patient will increase water intake to at least 50oz of water a day in order to improve bladder health.  Baseline:  Goal status: INITIAL     LONG TERM GOALS: Target date: 07/07/2022    Pt will be independent with advanced HEP.    Baseline:  Goal status: INITIAL   2.  Pt will demonstrate normal pelvic floor muscle tone and A/ROM, able to achieve 4/5 strength with contractions and 10  sec endurance, in order to provide appropriate lumbopelvic support in functional activities.    Baseline:  Goal status: INITIAL   3.  Pt will report no episodes of urinary incontinence in order to improve confidence in community activities and personal hygiene.    Baseline:  Goal status: INITIAL   PLAN: PT FREQUENCY: 1x/week   PT DURATION: 8 weeks   PLANNED INTERVENTIONS: Therapeutic exercises, Therapeutic activity, Neuromuscular re-education, Balance training, Gait training, Patient/Family education, Joint mobilization, Dry Needling, Biofeedback, and Manual therapy   PLAN FOR NEXT SESSION: hold off on bladder retraining for now - assess improvements; progress core/pelvic floor functional strengthening.    Heather Roberts, PT, DPT07/11/233:33 PM

## 2022-06-29 ENCOUNTER — Ambulatory Visit: Payer: Medicare Other | Admitting: Physical Therapy

## 2022-07-04 ENCOUNTER — Telehealth: Payer: Self-pay | Admitting: Internal Medicine

## 2022-07-04 NOTE — Telephone Encounter (Signed)
scheduled

## 2022-07-04 NOTE — Telephone Encounter (Signed)
Maria Perkins [phone]  [name] called to say on Friday and Saturday she had a pain like a twinge in her left side rib cage area that would last 15-20 seconds. None on Sunday and Monday, but she feels she should come in and talk with you about it and maybe have that Cardiac testing you had once talk about. I scheduled her for tomorrow.

## 2022-07-05 ENCOUNTER — Ambulatory Visit: Payer: Medicare Other | Admitting: Physical Therapy

## 2022-07-05 ENCOUNTER — Encounter: Payer: Self-pay | Admitting: Internal Medicine

## 2022-07-05 ENCOUNTER — Ambulatory Visit (INDEPENDENT_AMBULATORY_CARE_PROVIDER_SITE_OTHER): Payer: Medicare Other | Admitting: Internal Medicine

## 2022-07-05 VITALS — BP 106/60 | HR 77 | Temp 98.7°F | Wt 116.1 lb

## 2022-07-05 DIAGNOSIS — R0781 Pleurodynia: Secondary | ICD-10-CM

## 2022-07-05 DIAGNOSIS — Z8249 Family history of ischemic heart disease and other diseases of the circulatory system: Secondary | ICD-10-CM

## 2022-07-05 DIAGNOSIS — E039 Hypothyroidism, unspecified: Secondary | ICD-10-CM | POA: Diagnosis not present

## 2022-07-05 DIAGNOSIS — M810 Age-related osteoporosis without current pathological fracture: Secondary | ICD-10-CM | POA: Diagnosis not present

## 2022-07-05 NOTE — Progress Notes (Signed)
   Subjective:    Patient ID: Maria Perkins, female    DOB: 08-03-1951, 71 y.o.   MRN: [id number]  HPI 71 year old Female in excellent health presents with twinge of pain left rib cage area that would last 15-20 seconds. This made her concerned for CV with her family history.  Was sent to PT  by Dr. Edwinna Areola for urinary incontinence. Also using estrogen cream. Had Medicare wellness visit here in March. Mammogram was normal in March. Bone density 2022 showed T score -2.5 c/w osteoporosis.  Patient took bisphosphonates in the 1990s but developed peptic ulcer disease.  She did Reclast for 3 years from 29 15-20 5.  Had no falls or fractures.  She now sees endocrinologist through [facility] here in [place].  She does take vitamin D.  Had intact PTH level done in November 2022 which was normal.  Vitamin D level in October 2022 was 36.  No falls or fractures.  Has had low TSH to near normal TSH despite decreasing doses of levothyroxine.  Was thought to be an absorption issue by endocrinologist and I would concur with that.  Is to have DEXA scan in December 2023.  Endocrinologist is planning to give Evenity and January.  Hx hypothyroidism longstanding and insomnia.  FHx: mother died from complications of a stroke. Family had DM and dementia.   Review of Systems no frank chest pain or SOB. No hemoptysis.     Objective:   Physical Exam  Vital signs reviewed.  Blood pressure excellent at 106/60 weight 116 pounds 1.9 ounces BMI 20.25 Neck is supple.  No thyromegaly.  Chest clear.  Cardiac exam regular rate and rhythm.  Mild lower left rib pain.  We discussed getting an x-ray but there is no history of trauma.  We discussed family history of coronary artery disease and patient is interested in having coronary calcium score performed.  We will place order.     Assessment & Plan:   Left lower rib pain-likely musculoskeletal.  Can order x-ray of patient desires.  Hx of  osteoporosis- will start treatment with Evenity per Endocrinology in January. Will be having bone density.  History of cardiovascular disease in the family-order coronary calcium scoring.  Patient had lipid panel in March and total cholesterol was 207 but HDL is very high at 90 and LDL was very slightly elevated at 101.  Triglycerides have been normal.  HDL excellent.  Time spent with patient discussing these issues is 30 minutes  Addendum: Patient has excellent coronary calcium score which was done on August 15 of 0.

## 2022-07-05 NOTE — Patient Instructions (Addendum)
Coronary calcium score ordered.  Call if rib pain does not improve.  Medicare wellness visit scheduled for March 2024.

## 2022-07-06 ENCOUNTER — Encounter (INDEPENDENT_AMBULATORY_CARE_PROVIDER_SITE_OTHER): Payer: Medicare Other | Admitting: Ophthalmology

## 2022-07-06 DIAGNOSIS — D3131 Benign neoplasm of right choroid: Secondary | ICD-10-CM | POA: Diagnosis not present

## 2022-07-06 DIAGNOSIS — H353131 Nonexudative age-related macular degeneration, bilateral, early dry stage: Secondary | ICD-10-CM

## 2022-07-06 DIAGNOSIS — H43813 Vitreous degeneration, bilateral: Secondary | ICD-10-CM

## 2022-07-06 DIAGNOSIS — Z85828 Personal history of other malignant neoplasm of skin: Secondary | ICD-10-CM | POA: Diagnosis not present

## 2022-07-06 DIAGNOSIS — L570 Actinic keratosis: Secondary | ICD-10-CM | POA: Diagnosis not present

## 2022-07-06 DIAGNOSIS — D225 Melanocytic nevi of trunk: Secondary | ICD-10-CM | POA: Diagnosis not present

## 2022-07-06 DIAGNOSIS — D0439 Carcinoma in situ of skin of other parts of face: Secondary | ICD-10-CM | POA: Diagnosis not present

## 2022-07-06 DIAGNOSIS — H35372 Puckering of macula, left eye: Secondary | ICD-10-CM | POA: Diagnosis not present

## 2022-07-06 DIAGNOSIS — L821 Other seborrheic keratosis: Secondary | ICD-10-CM | POA: Diagnosis not present

## 2022-07-12 ENCOUNTER — Encounter: Payer: Medicare Other | Admitting: Physical Therapy

## 2022-07-28 ENCOUNTER — Ambulatory Visit: Payer: Medicare Other | Attending: Obstetrics & Gynecology | Admitting: Physical Therapy

## 2022-07-28 DIAGNOSIS — R293 Abnormal posture: Secondary | ICD-10-CM | POA: Diagnosis not present

## 2022-07-28 DIAGNOSIS — M6281 Muscle weakness (generalized): Secondary | ICD-10-CM | POA: Insufficient documentation

## 2022-07-28 NOTE — Therapy (Signed)
OUTPATIENT PHYSICAL THERAPY TREATMENT NOTE   Patient Name: Maria Perkins MRN: [id number] DOB:[birth year], 71 y.o., female Today's Date: 07/28/2022  PCP: Elby Showers, MD REFERRING PROVIDER: Megan Salon, MD  END OF SESSION:   PT End of Session - 07/28/22 1106     Visit Number 4    Date for PT Re-Evaluation 10/20/22    Authorization Type Medicare    PT Start Time 1104    PT Stop Time 1142    PT Time Calculation (min) 38 min    Activity Tolerance Patient tolerated treatment well    Behavior During Therapy WFL for tasks assessed/performed               Past Medical History:  Diagnosis Date   Cervical disc disease    c5-c6-not recent    Fibrocystic breast disease in female    History of migraine headaches    Insomnia    Osteopenia    Thyroid disease    hypothyroidism   Past Surgical History:  Procedure Laterality Date   COLONOSCOPY     meniscal tear     rt knee dr duda   POLYPECTOMY     UPPER GASTROINTESTINAL ENDOSCOPY     Patient Active Problem List   Diagnosis Date Noted   Vaginal atrophy 04/07/2022   Stress incontinence 04/07/2022   Osteoporosis, postmenopausal 12/08/2016   Serum total bilirubin elevated 11/21/2015   Back pain 05/16/2013   Insomnia 04/19/2012   Hypothyroidism 10/17/2011    REFERRING DIAG: N39.42 (ICD-10-CM) - Urinary incontinence without sensory awareness  THERAPY DIAG:  Abnormal posture  Muscle weakness (generalized)  Rationale for Evaluation and Treatment Rehabilitation  PERTINENT HISTORY: 2 vaginal deliveries  PRECAUTIONS: NA  SUBJECTIVE: Patient states that she has been working on exercises but not as consistently.  Pt states having coffee in the morning and doing the errands is the time I notice it the most.  I reach a point where my bladder is too full and a drip comes out.  PAIN:  Are you having pain? No   SUBJECTIVE 05/12/22:                                                                                                                                                                                             SUBJECTIVE STATEMENT: Patient states that she is having issues with urinary incontinence and it is usually unexpected. She feels like she cannot do anything to stop the leaking once it starts. She has started taking localized estrogen cream and she feels like it is already helping.    Fluid intake: Yes: coffee in the  morning; she reports hard time trying to drink water       PAIN:  Are you having pain? No     PATIENT GOALS To regain control of body and avoid more extreme intervention in the future to stop leaking     BOWEL MOVEMENT Pain with bowel movement: No Type of bowel movement:Frequency 1x/day and Strain No Fully empty rectum: Yes: - Leakage: No Pads: No Fiber supplement: No   URINATION Pain with urination: No Fully empty bladder: Yes: has to think about it and gently pushes down to empty Stream:  all different directions Urgency: Yes: but she feels like she can control this with trying to activate pelvic floor - can usually make herself wait Frequency: 2-3 hours, 1x/night Leakage: Coughing, Sneezing, Laughing, and spontaneous leaking Pads: Yes: mostly when traveling or when she is in situations where it will be harder to get to the bathroom    INTERCOURSE Pain with intercourse:  Not currently sexually active - was painful in the past after menopause Ability to have vaginal penetration:  No Climax: infrequent, but non painful, briefer than it used to be     PREGNANCY Vaginal deliveries 2 Tearing Yes: none with first, minor tear with second C-section deliveries 0 Currently pregnant No   PROLAPSE None       OBJECTIVE 05/12/22:    COGNITION:            Overall cognitive status: Within functional limits for tasks assessed                          SENSATION:            Light touch: Appears intact            Proprioception: Appears intact   GAIT:   Comments:  WNL                POSTURE: posterior pelvic tilt        PALPATION:   General  2 finger width diastasis recti at umbilicus and down to pubic symphysis, no distortion with head lift                 External Perineal Exam evidence of decreased estrogen                             Internal Pelvic Floor Mild tenderness surrounding urethra bil reported as stinging   Patient confirms identification and approves PT to assess internal pelvic floor and treatment Yes   PELVIC MMT: Pelvic floor strength 2/5 Endurance 2 seconds Repeat contractions 7x with good coordination       TONE: WNL   PROLAPSE: WNL observed externally in supine  assessed:  TODAY'S TREATMENT 07/28/22 Neuromuscular re-education: Biofeedback Isolated kegels Breathing with kegel Squat with kegel throughout the movement Exercises:    TREATMENT 05/31/22 Neuromuscular re-education: Core facilitation: Leg extensions 10x bil Deadbug 2 x 10 Bird dog 2 x 10 Bear crawl hovers 10 x 5 sec Bridge march 10x Exercises: Strengthening: Squats (used green band initially to help improve core facilitation) 2 x 10 Push-ups (full) 2 x 5 Bil UE with green band, 2 x 10 Pallof press 10x bil, green band   TREATMENT 05/23/22 Neuromuscular re-education: Core retraining:  Transversus abdominus training with multimodal cues for improved motor control and breath coordination Core facilitation: Supine march 2 x 10 Pelvic floor contraction training: Quick flicks in seated 32D  Quick flicks in standing: 92E regular stance 10x wide stance 10x tandem stance bil    TREATMENT 05/12/22 EVAL  Neuromuscular re-education: Pelvic floor contraction training: Quick flicks 10O Long holds 5 x 10 seconds         PATIENT EDUCATION:  Education details: Exercise progressions  Person educated: Patient Education method: Explanation, Demonstration, Tactile cues, Verbal cues, and Handouts Education comprehension: verbalized  understanding     HOME EXERCISE PROGRAM: [id number]   ASSESSMENT:   CLINICAL IMPRESSION:  Pt has returned to continue PT since being out of town and helping her daughter with a new baby.  She has made progress with improved strength and has made good progress with her goals.  She is still having some weakness and leakage as noted and will benefit from skilled PT to continue to address these impairments for full return to functional activities without leakage.  Today's session included biofeedback to work on coordination with breathing and isolating the pelvic floor.     OBJECTIVE IMPAIRMENTS decreased activity tolerance, decreased coordination, decreased endurance, increased muscle spasms, postural dysfunction, and pain.    ACTIVITY LIMITATIONS continence   PARTICIPATION LIMITATIONS: interpersonal relationship and community activity   PERSONAL FACTORS 1 comorbidity: 2 vaginal deliveries  are also affecting patient's functional outcome.    REHAB POTENTIAL: Good   CLINICAL DECISION MAKING: Stable/uncomplicated   EVALUATION COMPLEXITY: Low     GOALS: Goals reviewed with patient? Yes   SHORT TERM GOALS: Target date: 06/09/2022   Pt will be independent with HEP.    Baseline: Goal status: MET   2.  Pt will be independent with the knack, urge suppression technique, and double voiding in order to improve bladder habits and decrease urinary incontinence.    Baseline:  Goal status: MET   3.  Patient will increase water intake to at least 50oz of water a day in order to improve bladder health.  Baseline: drinking more water Goal status: NOT met     LONG TERM GOALS: Target date: 10/20/22 Updated 07/28/22    Pt will be independent with advanced HEP.    Baseline:  Goal status: Ongoing   2.  Pt will demonstrate normal pelvic floor muscle tone and A/ROM, able to achieve 4/5 strength with contractions and 10 sec endurance, in order to provide appropriate lumbopelvic support in  functional activities.    Baseline: 3/5 x 10 sec Goal status: Ongoing   3.  Pt will report no episodes of urinary incontinence in order to improve confidence in community activities and personal hygiene.    Baseline:  Goal status: Ongoing   PLAN: PT FREQUENCY: 1x/week   PT DURATION: 8 weeks   PLANNED INTERVENTIONS: Therapeutic exercises, Therapeutic activity, Neuromuscular re-education, Balance training, Gait training, Patient/Family education, Joint mobilization, Dry Needling, Biofeedback, and Manual therapy   PLAN FOR NEXT SESSION: progress HEP to include dead lift and single leg  ex's; half kneel and lunges  [employer], PT 07/28/22 12:30 PM

## 2022-07-29 ENCOUNTER — Ambulatory Visit (HOSPITAL_COMMUNITY)
Admission: RE | Admit: 2022-07-29 | Discharge: 2022-07-29 | Disposition: A | Discharge status: Home / Self Care | Payer: Medicare Other | Source: Ambulatory Visit | Attending: Internal Medicine | Admitting: Internal Medicine

## 2022-07-29 DIAGNOSIS — Z8249 Family history of ischemic heart disease and other diseases of the circulatory system: Secondary | ICD-10-CM | POA: Insufficient documentation

## 2022-08-07 ENCOUNTER — Encounter: Payer: Self-pay | Admitting: Internal Medicine

## 2022-08-23 DIAGNOSIS — E039 Hypothyroidism, unspecified: Secondary | ICD-10-CM | POA: Diagnosis not present

## 2022-08-23 NOTE — Therapy (Addendum)
OUTPATIENT PHYSICAL THERAPY TREATMENT NOTE   Patient Name: Maria Perkins MRN: [id number] DOB:[birth year], 71 y.o., female Today's Date: 08/23/2022  PCP: Elby Showers, MD REFERRING PROVIDER: Megan Salon, MD  END OF SESSION:   Visit number - 5 Time start: 1015 Time ended: 1053 Total time: 38 min    Past Medical History:  Diagnosis Date   Cervical disc disease    c5-c6-not recent    Fibrocystic breast disease in female    History of migraine headaches    Insomnia    Osteopenia    Thyroid disease    hypothyroidism   Past Surgical History:  Procedure Laterality Date   COLONOSCOPY     meniscal tear     rt knee dr duda   POLYPECTOMY     UPPER GASTROINTESTINAL ENDOSCOPY     Patient Active Problem List   Diagnosis Date Noted   Vaginal atrophy 04/07/2022   Stress incontinence 04/07/2022   Osteoporosis, postmenopausal 12/08/2016   Serum total bilirubin elevated 11/21/2015   Back pain 05/16/2013   Insomnia 04/19/2012   Hypothyroidism 10/17/2011    REFERRING DIAG: N39.42 (ICD-10-CM) - Urinary incontinence without sensory awareness  THERAPY DIAG:  No diagnosis found.  Rationale for Evaluation and Treatment Rehabilitation  PERTINENT HISTORY: 2 vaginal deliveries  PRECAUTIONS: NA  SUBJECTIVE: I had leakage out of the blue one time and a small sneeze caused leakage one time.  I can hold it when my bladder is full and have to go.    PAIN:  Are you having pain? No   SUBJECTIVE 05/12/22:                                                                                                                                                                                            SUBJECTIVE STATEMENT: Patient states that she is having issues with urinary incontinence and it is usually unexpected. She feels like she cannot do anything to stop the leaking once it starts. She has started taking localized estrogen cream and she feels like it is already helping.    Fluid  intake: Yes: coffee in the morning; she reports hard time trying to drink water       PAIN:  Are you having pain? No     PATIENT GOALS To regain control of body and avoid more extreme intervention in the future to stop leaking     BOWEL MOVEMENT Pain with bowel movement: No Type of bowel movement:Frequency 1x/day and Strain No Fully empty rectum: Yes: - Leakage: No Pads: No Fiber supplement: No   URINATION Pain with urination: No Fully empty bladder: Yes:  has to think about it and gently pushes down to empty Stream:  all different directions Urgency: Yes: but she feels like she can control this with trying to activate pelvic floor - can usually make herself wait Frequency: 2-3 hours, 1x/night Leakage: Coughing, Sneezing, Laughing, and spontaneous leaking Pads: Yes: mostly when traveling or when she is in situations where it will be harder to get to the bathroom    INTERCOURSE Pain with intercourse:  Not currently sexually active - was painful in the past after menopause Ability to have vaginal penetration:  No Climax: infrequent, but non painful, briefer than it used to be     PREGNANCY Vaginal deliveries 2 Tearing Yes: none with first, minor tear with second C-section deliveries 0 Currently pregnant No   PROLAPSE None       OBJECTIVE 05/12/22:    COGNITION:            Overall cognitive status: Within functional limits for tasks assessed                          SENSATION:            Light touch: Appears intact            Proprioception: Appears intact   GAIT:   Comments: WNL                POSTURE: posterior pelvic tilt        PALPATION:   General  2 finger width diastasis recti at umbilicus and down to pubic symphysis, no distortion with head lift                 External Perineal Exam evidence of decreased estrogen                             Internal Pelvic Floor Mild tenderness surrounding urethra bil reported as stinging   Patient confirms  identification and approves PT to assess internal pelvic floor and treatment Yes   PELVIC MMT: Pelvic floor strength 2/5 Endurance 2 seconds Repeat contractions 7x with good coordination       TONE: WNL   PROLAPSE: WNL observed externally in supine  assessed:  TODAY'S TREATMENT 08/24/22  Neuro re-ed: cues to engage  the pelvic floor with the following exercises; exhaling with exertion  Exercises: Primal press ups 8 x Primal press with alt LE donkey kicks 8 x bil Primal press with alt shoulder taps 5 x bil Lateral lunges 10x bil Backwards lunges 10x bil Dead lifts - cues with mirror and TC - 20x   TREATMENT 07/28/22 Neuromuscular re-education: Biofeedback Isolated kegels Breathing with kegel Squat with kegel throughout the movement Exercises: Quadruped -    TREATMENT 05/31/22 Neuromuscular re-education: Core facilitation: Leg extensions 10x bil Deadbug 2 x 10 Bird dog 2 x 10 Bear crawl hovers 10 x 5 sec Bridge march 10x Exercises: Strengthening: Squats (used green band initially to help improve core facilitation) 2 x 10 Push-ups (full) 2 x 5 Bil UE with green band, 2 x 10 Pallof press 10x bil, green band         PATIENT EDUCATION:  Education details: Exercise progressions  Person educated: Patient Education method: Explanation, Demonstration, Tactile cues, Verbal cues, and Handouts Education comprehension: verbalized understanding     HOME EXERCISE PROGRAM: [id number]   ASSESSMENT:   CLINICAL IMPRESSION:  Pt did well with exercises and progressed core  and gluteal strength.  Pt needed some cues to keep spine neutral and pelvis neutral.  Pt able to progress to more challenging ex's and demonstrates improved posture awareness.  Pt will benefit from skilled PT to continue to address core strength and endurance for high level of activities without leakage     OBJECTIVE IMPAIRMENTS decreased activity tolerance, decreased coordination, decreased endurance,  increased muscle spasms, postural dysfunction, and pain.    ACTIVITY LIMITATIONS continence   PARTICIPATION LIMITATIONS: interpersonal relationship and community activity   PERSONAL FACTORS 1 comorbidity: 2 vaginal deliveries  are also affecting patient's functional outcome.    REHAB POTENTIAL: Good   CLINICAL DECISION MAKING: Stable/uncomplicated   EVALUATION COMPLEXITY: Low     GOALS: Goals reviewed with patient? Yes   SHORT TERM GOALS: Target date: 06/09/2022   Pt will be independent with HEP.    Baseline: Goal status: MET   2.  Pt will be independent with the knack, urge suppression technique, and double voiding in order to improve bladder habits and decrease urinary incontinence.    Baseline:  Goal status: MET   3.  Patient will increase water intake to at least 50oz of water a day in order to improve bladder health.  Baseline: drinking more water Goal status: NOT met     LONG TERM GOALS: Target date: 10/20/22 Updated 07/28/22    Pt will be independent with advanced HEP.    Baseline:  Goal status: Ongoing   2.  Pt will demonstrate normal pelvic floor muscle tone and A/ROM, able to achieve 4/5 strength with contractions and 10 sec endurance, in order to provide appropriate lumbopelvic support in functional activities.    Baseline: 3/5 x 10 sec Goal status: Ongoing   3.  Pt will report no episodes of urinary incontinence in order to improve confidence in community activities and personal hygiene.    Baseline: had leakage 2x small amount Goal status: Ongoing   PLAN: PT FREQUENCY: 1x/week   PT DURATION: 8 weeks   PLANNED INTERVENTIONS: Therapeutic exercises, Therapeutic activity, Neuromuscular re-education, Balance training, Gait training, Patient/Family education, Joint mobilization, Dry Needling, Biofeedback, and Manual therapy   PLAN FOR NEXT SESSION: progress HEP to include dead lift and single leg  ex's; half kneel and lunges  [employer],  PT 08/23/22 5:21 PM

## 2022-08-24 ENCOUNTER — Ambulatory Visit: Payer: Medicare Other | Attending: Obstetrics & Gynecology | Admitting: Physical Therapy

## 2022-08-24 ENCOUNTER — Encounter: Payer: Self-pay | Admitting: Physical Therapy

## 2022-08-24 DIAGNOSIS — R293 Abnormal posture: Secondary | ICD-10-CM | POA: Diagnosis not present

## 2022-08-24 DIAGNOSIS — M6281 Muscle weakness (generalized): Secondary | ICD-10-CM | POA: Insufficient documentation

## 2022-09-06 ENCOUNTER — Other Ambulatory Visit: Payer: Self-pay | Admitting: Internal Medicine

## 2022-09-14 ENCOUNTER — Encounter: Payer: Medicare Other | Admitting: Physical Therapy

## 2022-10-03 ENCOUNTER — Encounter: Payer: Self-pay | Admitting: Physical Therapy

## 2022-10-03 ENCOUNTER — Ambulatory Visit: Payer: Medicare Other | Attending: Obstetrics & Gynecology | Admitting: Physical Therapy

## 2022-10-03 DIAGNOSIS — M6281 Muscle weakness (generalized): Secondary | ICD-10-CM | POA: Insufficient documentation

## 2022-10-03 DIAGNOSIS — R293 Abnormal posture: Secondary | ICD-10-CM | POA: Insufficient documentation

## 2022-10-03 NOTE — Therapy (Signed)
OUTPATIENT PHYSICAL THERAPY TREATMENT NOTE   Patient Name: Maria Perkins MRN: [id number] DOB:02-01-1951, 71 y.o., female Today's Date: 10/03/2022  PCP: Elby Showers, MD REFERRING PROVIDER: Megan Salon, MD  END OF SESSION:   PT End of Session - 10/03/22 1014     Visit Number 6    Date for PT Re-Evaluation 10/20/22    Authorization Type Medicare    PT Start Time 1014    PT Stop Time 1054    PT Time Calculation (min) 40 min    Activity Tolerance Patient tolerated treatment well    Behavior During Therapy WFL for tasks assessed/performed                Past Medical History:  Diagnosis Date   Cervical disc disease    c5-c6-not recent    Fibrocystic breast disease in female    History of migraine headaches    Insomnia    Osteopenia    Thyroid disease    hypothyroidism   Past Surgical History:  Procedure Laterality Date   COLONOSCOPY     meniscal tear     rt knee dr duda   POLYPECTOMY     UPPER GASTROINTESTINAL ENDOSCOPY     Patient Active Problem List   Diagnosis Date Noted   Vaginal atrophy 04/07/2022   Stress incontinence 04/07/2022   Osteoporosis, postmenopausal 12/08/2016   Serum total bilirubin elevated 11/21/2015   Back pain 05/16/2013   Insomnia 04/19/2012   Hypothyroidism 10/17/2011    REFERRING DIAG: N39.42 (ICD-10-CM) - Urinary incontinence without sensory awareness  THERAPY DIAG:  Abnormal posture  Muscle weakness (generalized)  Rationale for Evaluation and Treatment Rehabilitation  PERTINENT HISTORY: 2 vaginal deliveries  PRECAUTIONS: NA  SUBJECTIVE: I had a tiny bit of leakage when I waited a while one time, so nothing significant.    PAIN:  Are you having pain? No   SUBJECTIVE 05/12/22:                                                                                                                                                                                            SUBJECTIVE STATEMENT: Patient states that she is  having issues with urinary incontinence and it is usually unexpected. She feels like she cannot do anything to stop the leaking once it starts. She has started taking localized estrogen cream and she feels like it is already helping.    Fluid intake: Yes: coffee in the morning; she reports hard time trying to drink water       PAIN:  Are you having pain? No     PATIENT GOALS To  regain control of body and avoid more extreme intervention in the future to stop leaking     BOWEL MOVEMENT Pain with bowel movement: No Type of bowel movement:Frequency 1x/day and Strain No Fully empty rectum: Yes: - Leakage: No Pads: No Fiber supplement: No   URINATION Pain with urination: No Fully empty bladder: Yes: has to think about it and gently pushes down to empty Stream:  all different directions Urgency: Yes: but she feels like she can control this with trying to activate pelvic floor - can usually make herself wait Frequency: 2-3 hours, 1x/night Leakage: Coughing, Sneezing, Laughing, and spontaneous leaking Pads: Yes: mostly when traveling or when she is in situations where it will be harder to get to the bathroom    INTERCOURSE Pain with intercourse:  Not currently sexually active - was painful in the past after menopause Ability to have vaginal penetration:  No Climax: infrequent, but non painful, briefer than it used to be     PREGNANCY Vaginal deliveries 2 Tearing Yes: none with first, minor tear with second C-section deliveries 0 Currently pregnant No   PROLAPSE None       OBJECTIVE 05/12/22:    COGNITION:            Overall cognitive status: Within functional limits for tasks assessed                          SENSATION:            Light touch: Appears intact            Proprioception: Appears intact   GAIT:   Comments: WNL                POSTURE: posterior pelvic tilt        PALPATION:   General  2 finger width diastasis recti at umbilicus and down to pubic  symphysis, no distortion with head lift                 External Perineal Exam evidence of decreased estrogen                             Internal Pelvic Floor Mild tenderness surrounding urethra bil reported as stinging   Patient confirms identification and approves PT to assess internal pelvic floor and treatment Yes   PELVIC MMT: Pelvic floor strength 2/5 Endurance 2 seconds Repeat contractions 7x with good coordination       TONE: WNL   PROLAPSE: WNL observed externally in supine  assessed:  TODAY'S TREATMENT 10/03/22  Neuro re-ed: cues to engage transversus abdominus and keep from collapsing ribcage to the front  Exercises: Primal press ups 3 x 8 sec Primal press with alt LE donkey kicks 8 x bil Primal press with alt shoulder taps 5 x bil Lateral lunges 10x bil - holding 6 lb, 10 x with blue band Dead lifts - cues with mirror and TC - 20x Shoulder flex single arm - LE tapping behind - 10x Wall with ball behind - blue band - horizontal abduction - 10x  TREATMENT 08/24/22  Neuro re-ed: cues to engage  the pelvic floor with the following exercises; exhaling with exertion  Exercises: Primal press ups 8 x Primal press with alt LE donkey kicks 8 x bil Primal press with alt shoulder taps 5 x bil Lateral lunges 10x bil Backwards lunges 10x bil Dead lifts - cues with  mirror and TC - 20x   TREATMENT 07/28/22 Neuromuscular re-education: Biofeedback Isolated kegels Breathing with kegel Squat with kegel throughout the movement Exercises: Quadruped -    TREATMENT 05/31/22 Neuromuscular re-education: Core facilitation: Leg extensions 10x bil Deadbug 2 x 10 Bird dog 2 x 10 Bear crawl hovers 10 x 5 sec Bridge march 10x Exercises: Strengthening: Squats (used green band initially to help improve core facilitation) 2 x 10 Push-ups (full) 2 x 5 Bil UE with green band, 2 x 10 Pallof press 10x bil, green band         PATIENT EDUCATION:  Education details: Exercise  progressions  Person educated: Patient Education method: Explanation, Demonstration, Tactile cues, Verbal cues, and Handouts Education comprehension: verbalized understanding     HOME EXERCISE PROGRAM: [id number]   ASSESSMENT:   CLINICAL IMPRESSION:  Pt reports that she can stop the urge and has had essentially no leakage for the entire month.  Pt is doing great with her HEP and will d/c from PT today.     OBJECTIVE IMPAIRMENTS decreased activity tolerance, decreased coordination, decreased endurance, increased muscle spasms, postural dysfunction, and pain.    ACTIVITY LIMITATIONS continence   PARTICIPATION LIMITATIONS: interpersonal relationship and community activity   PERSONAL FACTORS 1 comorbidity: 2 vaginal deliveries  are also affecting patient's functional outcome.    REHAB POTENTIAL: Good   CLINICAL DECISION MAKING: Stable/uncomplicated   EVALUATION COMPLEXITY: Low     GOALS: Goals reviewed with patient? Yes   SHORT TERM GOALS: Target date: 06/09/2022   Pt will be independent with HEP.    Baseline: Goal status: MET   2.  Pt will be independent with the knack, urge suppression technique, and double voiding in order to improve bladder habits and decrease urinary incontinence.    Baseline:  Goal status: MET   3.  Patient will increase water intake to at least 50oz of water a day in order to improve bladder health.  Baseline: drinking more water Goal status: NOT met     LONG TERM GOALS: Target date: 10/20/22 Updated 07/28/22    Pt will be independent with advanced HEP.    Baseline:  Goal status: MET   2.  Pt will demonstrate normal pelvic floor muscle tone and A/ROM, able to achieve 4/5 strength with contractions and 10 sec endurance, in order to provide appropriate lumbopelvic support in functional activities.    Baseline: able to kegel correctly and still working on endurance Goal status: Partially met   3.  Pt will report no episodes of urinary  incontinence in order to improve confidence in community activities and personal hygiene.    Baseline: none Goal status: MET   PLAN: PT FREQUENCY: 1x/week   PT DURATION: 8 weeks   PLANNED INTERVENTIONS: Therapeutic exercises, Therapeutic activity, Neuromuscular re-education, Balance training, Gait training, Patient/Family education, Joint mobilization, Dry Needling, Biofeedback, and Manual therapy   PLAN FOR NEXT SESSION: d/c today  Gustavus Bryant, PT 10/03/22 10:15 AM   PHYSICAL THERAPY DISCHARGE SUMMARY  Visits from Start of Care: 6  Current functional level related to goals / functional outcomes: See above goals   Remaining deficits: See above   Education / Equipment: HEP   Patient agrees to discharge. Patient goals were met. Patient is being discharged due to being pleased with the current functional level.  Gustavus Bryant, PT 10/03/22 11:01 AM

## 2022-10-18 ENCOUNTER — Encounter: Payer: Self-pay | Admitting: Physical Therapy

## 2022-11-23 DIAGNOSIS — M810 Age-related osteoporosis without current pathological fracture: Secondary | ICD-10-CM | POA: Diagnosis not present

## 2022-11-23 DIAGNOSIS — Z780 Asymptomatic menopausal state: Secondary | ICD-10-CM | POA: Diagnosis not present

## 2022-11-28 DIAGNOSIS — E039 Hypothyroidism, unspecified: Secondary | ICD-10-CM | POA: Diagnosis not present

## 2022-11-28 DIAGNOSIS — Z79890 Hormone replacement therapy (postmenopausal): Secondary | ICD-10-CM | POA: Diagnosis not present

## 2022-11-28 DIAGNOSIS — M810 Age-related osteoporosis without current pathological fracture: Secondary | ICD-10-CM | POA: Diagnosis not present

## 2023-01-18 DIAGNOSIS — M25579 Pain in unspecified ankle and joints of unspecified foot: Secondary | ICD-10-CM | POA: Diagnosis not present

## 2023-01-26 DIAGNOSIS — M25579 Pain in unspecified ankle and joints of unspecified foot: Secondary | ICD-10-CM | POA: Diagnosis not present

## 2023-01-30 ENCOUNTER — Telehealth: Payer: Self-pay | Admitting: Internal Medicine

## 2023-01-30 NOTE — Telephone Encounter (Signed)
Maria Perkins [phone]  [name] called to see if she can have Dr Karie Georges her endocrinologist, labs added to her labs TSH, 3rd generation, Free T4, T5, Vit D. She seen him in January and he did not do labs since she was seeing you this month. She goes back to him in July.

## 2023-01-30 NOTE — Telephone Encounter (Signed)
Talked with patient and explained how the labs work with different doctors and it is best that her endocrinologist continues to order her thyroid labs like he has been doing. We will just order her physical labs.

## 2023-01-30 NOTE — Telephone Encounter (Signed)
LVM for patient to CB. I made mistake it should be T3 instead of T5

## 2023-02-01 DIAGNOSIS — M25579 Pain in unspecified ankle and joints of unspecified foot: Secondary | ICD-10-CM | POA: Diagnosis not present

## 2023-02-06 NOTE — Progress Notes (Addendum)
Annual Wellness Visit    Patient Care Team: Elby Showers, MD as PCP - General (Internal Medicine)  Visit Date: 02/13/23   Chief Complaint  Patient presents with   Medicare Wellness   Annual Exam    Subjective:   Patient: Maria Perkins, Female    DOB: 1951/10/01, 72 y.o.   MRN: [id number]  Maria Perkins is a 72 y.o. Female who presents today for her Annual Wellness Visit. She has a history of cervical disc disease, fibrocystic breast disease, migraine headaches, insomnia, osteoporosis, thyroid disease.  Reports feeling well regarding general health.  History of hypothyroidism treated with Synthroid 112 mcg daily before breakfast. TSH low at 0.46 on 08/23/22.  History of insomnia treated with Ambien 5 mg at bedtime as needed.  Liver function normal. CHOL slightly elevated at 202 on 02/10/23, lipid panel otherwise normal.  Mammogram last completed 02/09/22. No mammographic evidence of malignancy.   Colonoscopy last completed 04/23/15. Sessile polyp in rectum, removed. Pathology showed hyperplastic colonic polyp. Recommended repeat in 2026.  DEXA scan last completed 11/23/22. T-score at -2.5 in femoral neck. Patient is osteoporotic.   Cardiac calcium score of 0 on 07/29/2022. Mild aortic atherosclerosis.  Sees Dr. Denton Lank at [facility]. Thinking of taking Reclast in the the near future.  Past Medical History:  Diagnosis Date   Cervical disc disease    c5-c6-not recent    Fibrocystic breast disease in female    History of migraine headaches    Insomnia    Osteopenia    Thyroid disease    hypothyroidism     Family History  Problem Relation Age of Onset   Stroke Mother    Colon polyps Mother    Diabetes Father    Mental illness Father    Colon polyps Brother    Colon cancer Neg Hx    Esophageal cancer Neg Hx    Rectal cancer Neg Hx    Stomach cancer Neg Hx      Social History   Social History Narrative   Not on file     Review of Systems   Constitutional:  Negative for chills, fever, malaise/fatigue and weight loss.  HENT:  Negative for hearing loss, sinus pain and sore throat.   Respiratory:  Negative for cough, hemoptysis and shortness of breath.   Cardiovascular:  Negative for chest pain, palpitations, leg swelling and PND.  Gastrointestinal:  Negative for abdominal pain, constipation, diarrhea, heartburn, nausea and vomiting.  Genitourinary:  Negative for dysuria, frequency and urgency.  Musculoskeletal:  Negative for back pain, myalgias and neck pain.  Skin:  Negative for itching and rash.  Neurological:  Negative for dizziness, tingling, seizures and headaches.  Endo/Heme/Allergies:  Negative for polydipsia.  Psychiatric/Behavioral:  Negative for depression. The patient is not nervous/anxious.       Objective:   Vitals: BP 100/62   Pulse 62   Temp 98.1 F (36.7 C) (Tympanic)   Ht 5' 3.5" (1.613 m)   Wt 116 lb 1.9 oz (52.7 kg)   SpO2 98%   BMI 20.25 kg/m   Physical Exam Vitals and nursing note reviewed.  Constitutional:      General: She is not in acute distress.    Appearance: Normal appearance. She is not ill-appearing or toxic-appearing.  HENT:     Head: Normocephalic and atraumatic.     Right Ear: Hearing, tympanic membrane, ear canal and external ear normal.     Left Ear: Hearing, tympanic membrane, ear  canal and external ear normal.     Mouth/Throat:     Pharynx: Oropharynx is clear.  Eyes:     Extraocular Movements: Extraocular movements intact.     Pupils: Pupils are equal, round, and reactive to light.  Neck:     Thyroid: No thyroid mass, thyromegaly or thyroid tenderness.     Vascular: No carotid bruit.  Cardiovascular:     Rate and Rhythm: Normal rate and regular rhythm. No extrasystoles are present.    Pulses:          Dorsalis pedis pulses are 1+ on the right side and 1+ on the left side.     Heart sounds: Normal heart sounds. No murmur heard.    No friction rub. No gallop.   Pulmonary:     Effort: Pulmonary effort is normal.     Breath sounds: Normal breath sounds. No decreased breath sounds, wheezing, rhonchi or rales.  Chest:     Chest wall: No mass.     Comments: Thickening in left breast outer quadrant. Abdominal:     Palpations: Abdomen is soft. There is no hepatomegaly, splenomegaly or mass.     Tenderness: There is no abdominal tenderness.     Hernia: No hernia is present.  Musculoskeletal:     Cervical back: Normal range of motion.     Right lower leg: No edema.     Left lower leg: No edema.  Lymphadenopathy:     Cervical: No cervical adenopathy.     Upper Body:     Right upper body: No supraclavicular adenopathy.     Left upper body: No supraclavicular adenopathy.  Skin:    General: Skin is warm and dry.  Neurological:     General: No focal deficit present.     Mental Status: She is alert and oriented to person, place, and time. Mental status is at baseline.     Sensory: Sensation is intact.     Motor: Motor function is intact. No weakness.     Deep Tendon Reflexes: Reflexes are normal and symmetric.  Psychiatric:        Attention and Perception: Attention normal.        Mood and Affect: Mood normal.        Speech: Speech normal.        Behavior: Behavior normal.        Thought Content: Thought content normal.        Cognition and Memory: Cognition normal.        Judgment: Judgment normal.      Most recent functional status assessment:    02/13/2023   10:14 AM  In your present state of health, do you have any difficulty performing the following activities:  Hearing? 0  Vision? 0  Difficulty concentrating or making decisions? 0  Walking or climbing stairs? 0  Dressing or bathing? 0  Doing errands, shopping? 0  Preparing Food and eating ? N  Using the Toilet? N  In the past six months, have you accidently leaked urine? N  Do you have problems with loss of bowel control? N  Managing your Medications? N  Managing your  Finances? N  Housekeeping or managing your Housekeeping? N   Most recent fall risk assessment:    02/13/2023   10:14 AM  Fall Risk   Falls in the past year? 0  Number falls in past yr: 0  Injury with Fall? 0  Risk for fall due to : No Fall Risks  Follow up Falls prevention discussed    Most recent depression screenings:    02/13/2023   10:14 AM 07/05/2022   11:03 AM  PHQ 2/9 Scores  PHQ - 2 Score 0 0   Most recent cognitive screening:    02/13/2023   10:17 AM  6CIT Screen  What Year? 0 points  What month? 0 points  What time? 0 points  Count back from 20 0 points  Months in reverse 0 points     Results:   Studies obtained and personally reviewed by me:  Mammogram last completed 02/09/22. No mammographic evidence of malignancy. Recommended repeat in 2025.  Colonoscopy last completed 04/23/15. Sessile polyp in rectum, removed. Pathology showed hyperplastic colonic polyp. Recommended repeat in 2026.  DEXA scan last completed 11/23/22. T-score at -2.5 in femoral neck. Patient is osteoporotic.    Labs:       Component Value Date/Time   NA 140 02/10/2023 0913   K 4.9 02/10/2023 0913   CL 103 02/10/2023 0913   CO2 29 02/10/2023 0913   GLUCOSE 93 02/10/2023 0913   BUN 18 02/10/2023 0913   CREATININE 0.74 02/10/2023 0913   CALCIUM 9.8 02/10/2023 0913   PROT 6.5 02/10/2023 0913   ALBUMIN 4.6 05/29/2017 0857   AST 36 (H) 02/10/2023 0913   ALT 17 02/10/2023 0913   ALKPHOS 55 05/29/2017 0857   BILITOT 1.7 (H) 02/10/2023 0913   GFRNONAA 84 02/08/2021 0936   GFRAA 98 02/08/2021 0936     Lab Results  Component Value Date   WBC 3.8 02/10/2023   HGB 13.8 02/10/2023   HCT 41.2 02/10/2023   MCV 93.2 02/10/2023   PLT 211 02/10/2023    Lab Results  Component Value Date   CHOL 202 (H) 02/10/2023   HDL 94 02/10/2023   LDLCALC 92 02/10/2023   TRIG 72 02/10/2023   CHOLHDL 2.1 02/10/2023    No results found for: "HGBA1C"   Lab Results  Component Value Date   TSH  0.09 (L) 02/07/2022    Assessment & Plan:   Hypothyroidism: treated with Synthroid 112 mcg daily before breakfast. TSH low at 0.46 on 08/23/22.  Insomnia: treated with Ambien 5 mg at bedtime as needed.  Mammogram: last completed 02/09/22. No mammographic evidence of malignancy. Ordered mammogram.  Colonoscopy: last completed 04/23/15. Sessile polyp in rectum, removed. Pathology showed hyperplastic colonic polyp. Recommended repeat in 2026.  DEXA scan: last completed 11/23/22. T-score at -2.5 in femoral neck. Patient is osteoporotic.   Cardiac calcium score of 0 on 07/29/2022. Mild aortic atherosclerosis.  Vaccine Counseling: Reports UTD on flu, Covid, RSV vaccines. Tetanus update due 12/24. Administered pneumococcal 20 vaccine.   Return in 6 months for health maintenance exam.     Annual wellness visit done today including the all of the following: Reviewed patient's Family Medical History Reviewed and updated list of patient's medical providers Assessment of cognitive impairment was done Assessed patient's functional ability Established a written schedule for health screening [place] Completed and Reviewed  Discussed health benefits of physical activity, and encouraged her to engage in regular exercise appropriate for her age and condition.        I,Alexander Ruley,acting as a Education administrator for Elby Showers, MD.,have documented all relevant documentation on the behalf of Elby Showers, MD,as directed by  Elby Showers, MD while in the presence of Elby Showers, MD.   I, Elby Showers, MD, have reviewed all documentation for this visit. The  documentation on 02/13/23 for the exam, diagnosis, procedures, and orders are all accurate and complete.

## 2023-02-09 DIAGNOSIS — M25579 Pain in unspecified ankle and joints of unspecified foot: Secondary | ICD-10-CM | POA: Diagnosis not present

## 2023-02-10 ENCOUNTER — Other Ambulatory Visit: Payer: Medicare Other

## 2023-02-10 DIAGNOSIS — Z136 Encounter for screening for cardiovascular disorders: Secondary | ICD-10-CM

## 2023-02-10 DIAGNOSIS — Z0000 Encounter for general adult medical examination without abnormal findings: Secondary | ICD-10-CM

## 2023-02-10 DIAGNOSIS — Z13220 Encounter for screening for lipoid disorders: Secondary | ICD-10-CM | POA: Diagnosis not present

## 2023-02-10 DIAGNOSIS — M549 Dorsalgia, unspecified: Secondary | ICD-10-CM

## 2023-02-10 DIAGNOSIS — E039 Hypothyroidism, unspecified: Secondary | ICD-10-CM

## 2023-02-10 DIAGNOSIS — M810 Age-related osteoporosis without current pathological fracture: Secondary | ICD-10-CM

## 2023-02-11 LAB — LIPID PANEL
Cholesterol: 202 mg/dL — ABNORMAL HIGH (ref <200)
HDL: 94 mg/dL (ref >=50)
LDL Cholesterol (Calc): 92 mg/dL (calc)
Non-HDL Cholesterol (Calc): 108 mg/dL (calc) (ref <130)
Total CHOL/HDL Ratio: 2.1 (calc) (ref <5.0)
Triglycerides: 72 mg/dL (ref <150)

## 2023-02-11 LAB — CBC WITH DIFFERENTIAL/PLATELET
Absolute Monocytes: 338 cells/uL (ref 200–950)
Basophils Absolute: 30 cells/uL (ref 0–200)
Basophils Relative: 0.8 %
Eosinophils Absolute: 129 cells/uL (ref 15–500)
Eosinophils Relative: 3.4 %
HCT: 41.2 % (ref 35.0–45.0)
Hemoglobin: 13.8 g/dL (ref 11.7–15.5)
Lymphs Abs: 1376 cells/uL (ref 850–3900)
MCH: 31.2 pg (ref 27.0–33.0)
MCHC: 33.5 g/dL (ref 32.0–36.0)
MCV: 93.2 fL (ref 80.0–100.0)
MPV: 10.3 fL (ref 7.5–12.5)
Monocytes Relative: 8.9 %
Neutro Abs: 1927 cells/uL (ref 1500–7800)
Neutrophils Relative %: 50.7 %
Platelets: 211 10*3/uL (ref 140–400)
RBC: 4.42 10*6/uL (ref 3.80–5.10)
RDW: 12.4 % (ref 11.0–15.0)
Total Lymphocyte: 36.2 %
WBC: 3.8 10*3/uL (ref 3.8–10.8)

## 2023-02-11 LAB — COMPLETE METABOLIC PANEL WITH GFR
AG Ratio: 2.3 (calc) (ref 1.0–2.5)
ALT: 17 U/L (ref 6–29)
AST: 36 U/L — ABNORMAL HIGH (ref 10–35)
Albumin: 4.5 g/dL (ref 3.6–5.1)
Alkaline phosphatase (APISO): 71 U/L (ref 37–153)
BUN: 18 mg/dL (ref 7–25)
CO2: 29 mmol/L (ref 20–32)
Calcium: 9.8 mg/dL (ref 8.6–10.4)
Chloride: 103 mmol/L (ref 98–110)
Creat: 0.74 mg/dL (ref 0.60–1.00)
Globulin: 2 g/dL (calc) (ref 1.9–3.7)
Glucose, Bld: 93 mg/dL (ref 65–99)
Potassium: 4.9 mmol/L (ref 3.5–5.3)
Sodium: 140 mmol/L (ref 135–146)
Total Bilirubin: 1.7 mg/dL — ABNORMAL HIGH (ref 0.2–1.2)
Total Protein: 6.5 g/dL (ref 6.1–8.1)
eGFR: 85 mL/min/{1.73_m2} (ref >=60)

## 2023-02-13 ENCOUNTER — Ambulatory Visit (INDEPENDENT_AMBULATORY_CARE_PROVIDER_SITE_OTHER): Payer: Medicare Other | Admitting: Internal Medicine

## 2023-02-13 ENCOUNTER — Encounter: Payer: Self-pay | Admitting: Internal Medicine

## 2023-02-13 VITALS — BP 100/62 | HR 62 | Temp 98.1°F | Ht 63.5 in | Wt 116.1 lb

## 2023-02-13 DIAGNOSIS — Z780 Asymptomatic menopausal state: Secondary | ICD-10-CM

## 2023-02-13 DIAGNOSIS — Z1231 Encounter for screening mammogram for malignant neoplasm of breast: Secondary | ICD-10-CM | POA: Diagnosis not present

## 2023-02-13 DIAGNOSIS — E039 Hypothyroidism, unspecified: Secondary | ICD-10-CM | POA: Diagnosis not present

## 2023-02-13 DIAGNOSIS — Z0000 Encounter for general adult medical examination without abnormal findings: Secondary | ICD-10-CM | POA: Diagnosis not present

## 2023-02-13 DIAGNOSIS — Z23 Encounter for immunization: Secondary | ICD-10-CM | POA: Diagnosis not present

## 2023-02-13 DIAGNOSIS — M810 Age-related osteoporosis without current pathological fracture: Secondary | ICD-10-CM

## 2023-02-13 DIAGNOSIS — F5101 Primary insomnia: Secondary | ICD-10-CM | POA: Diagnosis not present

## 2023-02-13 DIAGNOSIS — Z8249 Family history of ischemic heart disease and other diseases of the circulatory system: Secondary | ICD-10-CM

## 2023-02-13 LAB — HEMOCCULT GUIAC POC 1CARD (OFFICE): Fecal Occult Blood, POC: NEGATIVE

## 2023-02-13 LAB — POCT URINALYSIS DIPSTICK
Bilirubin, UA: NEGATIVE
Blood, UA: NEGATIVE
Glucose, UA: NEGATIVE
Ketones, UA: NEGATIVE
Leukocytes, UA: NEGATIVE
Nitrite, UA: NEGATIVE
Protein, UA: NEGATIVE
Spec Grav, UA: 1.015 (ref 1.010–1.025)
Urobilinogen, UA: 0.2 E.U./dL
pH, UA: 6 (ref 5.0–8.0)

## 2023-02-18 DIAGNOSIS — M25579 Pain in unspecified ankle and joints of unspecified foot: Secondary | ICD-10-CM | POA: Diagnosis not present

## 2023-02-23 DIAGNOSIS — M25579 Pain in unspecified ankle and joints of unspecified foot: Secondary | ICD-10-CM | POA: Diagnosis not present

## 2023-03-17 ENCOUNTER — Other Ambulatory Visit: Payer: Self-pay | Admitting: Internal Medicine

## 2023-04-06 ENCOUNTER — Ambulatory Visit: Payer: Medicare Other

## 2023-04-18 ENCOUNTER — Other Ambulatory Visit (HOSPITAL_BASED_OUTPATIENT_CLINIC_OR_DEPARTMENT_OTHER): Payer: Self-pay | Admitting: Obstetrics & Gynecology

## 2023-04-18 DIAGNOSIS — N952 Postmenopausal atrophic vaginitis: Secondary | ICD-10-CM

## 2023-04-19 DIAGNOSIS — M25532 Pain in left wrist: Secondary | ICD-10-CM | POA: Diagnosis not present

## 2023-04-19 NOTE — Telephone Encounter (Signed)
LMOVM for pt to call regarding refill request. Still using? Needs appt

## 2023-04-25 DIAGNOSIS — M25532 Pain in left wrist: Secondary | ICD-10-CM | POA: Diagnosis not present

## 2023-06-01 DIAGNOSIS — M810 Age-related osteoporosis without current pathological fracture: Secondary | ICD-10-CM | POA: Diagnosis not present

## 2023-06-27 DIAGNOSIS — M810 Age-related osteoporosis without current pathological fracture: Secondary | ICD-10-CM | POA: Diagnosis not present

## 2023-07-05 DIAGNOSIS — H5203 Hypermetropia, bilateral: Secondary | ICD-10-CM | POA: Diagnosis not present

## 2023-07-17 ENCOUNTER — Encounter (INDEPENDENT_AMBULATORY_CARE_PROVIDER_SITE_OTHER): Payer: Medicare Other | Admitting: Ophthalmology

## 2023-07-17 DIAGNOSIS — D3131 Benign neoplasm of right choroid: Secondary | ICD-10-CM | POA: Diagnosis not present

## 2023-07-17 DIAGNOSIS — L821 Other seborrheic keratosis: Secondary | ICD-10-CM | POA: Diagnosis not present

## 2023-07-17 DIAGNOSIS — H43813 Vitreous degeneration, bilateral: Secondary | ICD-10-CM | POA: Diagnosis not present

## 2023-07-17 DIAGNOSIS — H33302 Unspecified retinal break, left eye: Secondary | ICD-10-CM | POA: Diagnosis not present

## 2023-07-17 DIAGNOSIS — H2513 Age-related nuclear cataract, bilateral: Secondary | ICD-10-CM

## 2023-07-17 DIAGNOSIS — H35372 Puckering of macula, left eye: Secondary | ICD-10-CM

## 2023-07-17 DIAGNOSIS — Z85828 Personal history of other malignant neoplasm of skin: Secondary | ICD-10-CM | POA: Diagnosis not present

## 2023-07-17 DIAGNOSIS — D225 Melanocytic nevi of trunk: Secondary | ICD-10-CM | POA: Diagnosis not present

## 2023-07-17 DIAGNOSIS — D3617 Benign neoplasm of peripheral nerves and autonomic nervous system of trunk, unspecified: Secondary | ICD-10-CM | POA: Diagnosis not present

## 2023-08-11 ENCOUNTER — Ambulatory Visit (HOSPITAL_BASED_OUTPATIENT_CLINIC_OR_DEPARTMENT_OTHER): Payer: Medicare Other | Admitting: Obstetrics & Gynecology

## 2023-08-29 ENCOUNTER — Ambulatory Visit (HOSPITAL_BASED_OUTPATIENT_CLINIC_OR_DEPARTMENT_OTHER): Payer: Medicare Other | Admitting: Obstetrics & Gynecology

## 2023-09-04 ENCOUNTER — Ambulatory Visit (HOSPITAL_BASED_OUTPATIENT_CLINIC_OR_DEPARTMENT_OTHER): Payer: Medicare Other | Admitting: Obstetrics & Gynecology

## 2023-09-04 ENCOUNTER — Other Ambulatory Visit (HOSPITAL_BASED_OUTPATIENT_CLINIC_OR_DEPARTMENT_OTHER): Payer: Self-pay

## 2023-09-04 ENCOUNTER — Encounter (HOSPITAL_BASED_OUTPATIENT_CLINIC_OR_DEPARTMENT_OTHER): Payer: Self-pay | Admitting: Obstetrics & Gynecology

## 2023-09-04 VITALS — BP 101/61 | HR 62 | Ht 63.25 in | Wt 116.4 lb

## 2023-09-04 DIAGNOSIS — M810 Age-related osteoporosis without current pathological fracture: Secondary | ICD-10-CM

## 2023-09-04 DIAGNOSIS — Z1231 Encounter for screening mammogram for malignant neoplasm of breast: Secondary | ICD-10-CM | POA: Diagnosis not present

## 2023-09-04 DIAGNOSIS — N393 Stress incontinence (female) (male): Secondary | ICD-10-CM

## 2023-09-04 DIAGNOSIS — N952 Postmenopausal atrophic vaginitis: Secondary | ICD-10-CM

## 2023-09-04 MED ORDER — COVID-19 MRNA VAC-TRIS(PFIZER) 30 MCG/0.3ML IM SUSY
0.3000 mL | PREFILLED_SYRINGE | Freq: Once | INTRAMUSCULAR | 0 refills | Status: AC
  Filled 2023-09-04: qty 0.3, 1d supply, fill #0

## 2023-09-04 NOTE — Patient Instructions (Signed)
Call [phone] to schedule an appointment at [facility].    Mammograms can also be self-scheduled online through MyChart.

## 2023-09-04 NOTE — Progress Notes (Signed)
GYNECOLOGY  VISIT  CC:   follow up, h/o incontinence  HPI: 72 y.o. [id number] Married White or Caucasian female here for follow up after discussing urinary incontinence last year at her last appointment.  She did go to physical therapy and learned a lot about her posture and the exercises.  She is still doing exercises.  She is using vaginal estrogen cream as well.  She has been able to stop using pads.  The only situation where she can leak a little bit if her bladder is full and she is trying to get to the bathroom.  Using estradiol cream 1 gram pv twice weekly.    Last mammogram was 02/09/2022.  Last BMD 11/2022.  On Reclast.     Past Medical History:  Diagnosis Date   Cervical disc disease    c5-c6-not recent    Fibrocystic breast disease in female    History of migraine headaches    Insomnia    Osteopenia    Thyroid disease    hypothyroidism    MEDS:   Current Outpatient Medications on File Prior to Visit  Medication Sig Dispense Refill   calcium-vitamin D (OSCAL WITH D) 500-5 MG-MCG tablet Take 1 tablet by mouth.     estradiol (ESTRACE) 0.1 MG/GM vaginal cream Place 1 gram vaginally twice weekly or apply small externally 3-4 times weekly 42.5 g 1   Multiple Vitamin (MULTIVITAMIN) tablet Take 1 tablet by mouth daily.     Multiple Vitamins-Minerals (PRESERVISION AREDS PO) Take by mouth.     SYNTHROID 112 MCG tablet TAKE 1 TABLET ONCE DAILY BEFORE BREAKFAST. 30 tablet 0   VITAMIN D PO Take by mouth.     zolpidem (AMBIEN) 5 MG tablet TAKE ONE TABLET BY MOUTH AT BEDTIME AS NEEDED FOR `SLEEP. 90 tablet 1   No current facility-administered medications on file prior to visit.    ALLERGIES: Patient has no known allergies.  SH:  married, non smoker  Review of Systems  Constitutional: Negative.   Genitourinary: Negative.     PHYSICAL EXAMINATION:    BP 101/61 (BP Location: Right Arm, Patient Position: Sitting, Cuff Size: Normal)   Pulse 62   Ht 5' 3.25" (1.607 m)   Wt 116 lb  6.4 oz (52.8 kg)   BMI 20.46 kg/m     Physical Exam Constitutional:      Appearance: Normal appearance.  Neurological:     General: No focal deficit present.     Mental Status: She is alert.  Psychiatric:        Mood and Affect: Mood normal.        Behavior: Behavior normal.      Assessment/Plan: 1. Stress incontinence - continue vaginal estradiol cream.  Does not need RF right now.  Will let me know when/if she does   2. Vaginal atrophy  3. 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 Encounter for screening mammogram for malignant neoplasm of breast - MM 3D DIAGNOSTIC MAMMOGRAM BILATERAL BREAST; Future   Total time with pt:  24 minutes

## 2023-09-25 ENCOUNTER — Other Ambulatory Visit: Payer: Self-pay | Admitting: Internal Medicine

## 2023-10-25 ENCOUNTER — Other Ambulatory Visit (HOSPITAL_BASED_OUTPATIENT_CLINIC_OR_DEPARTMENT_OTHER): Payer: Self-pay | Admitting: Obstetrics & Gynecology

## 2023-10-25 DIAGNOSIS — Z1231 Encounter for screening mammogram for malignant neoplasm of breast: Secondary | ICD-10-CM

## 2023-10-30 ENCOUNTER — Encounter (HOSPITAL_BASED_OUTPATIENT_CLINIC_OR_DEPARTMENT_OTHER): Payer: Self-pay | Admitting: Radiology

## 2023-10-30 ENCOUNTER — Ambulatory Visit (HOSPITAL_BASED_OUTPATIENT_CLINIC_OR_DEPARTMENT_OTHER)
Admission: RE | Admit: 2023-10-30 | Discharge: 2023-10-30 | Disposition: A | Discharge status: Home / Self Care | Payer: Medicare Other | Source: Ambulatory Visit | Attending: Obstetrics & Gynecology | Admitting: Obstetrics & Gynecology

## 2023-10-30 DIAGNOSIS — Z1231 Encounter for screening mammogram for malignant neoplasm of breast: Secondary | ICD-10-CM | POA: Insufficient documentation

## 2023-11-08 DIAGNOSIS — M810 Age-related osteoporosis without current pathological fracture: Secondary | ICD-10-CM | POA: Diagnosis not present

## 2023-11-08 DIAGNOSIS — E038 Other specified hypothyroidism: Secondary | ICD-10-CM | POA: Diagnosis not present

## 2023-11-08 DIAGNOSIS — E039 Hypothyroidism, unspecified: Secondary | ICD-10-CM | POA: Diagnosis not present

## 2023-11-13 DIAGNOSIS — E038 Other specified hypothyroidism: Secondary | ICD-10-CM | POA: Diagnosis not present

## 2023-11-13 DIAGNOSIS — M810 Age-related osteoporosis without current pathological fracture: Secondary | ICD-10-CM | POA: Diagnosis not present

## 2023-12-25 ENCOUNTER — Other Ambulatory Visit (HOSPITAL_BASED_OUTPATIENT_CLINIC_OR_DEPARTMENT_OTHER): Payer: Self-pay | Admitting: Obstetrics & Gynecology

## 2023-12-25 DIAGNOSIS — N952 Postmenopausal atrophic vaginitis: Secondary | ICD-10-CM

## 2024-02-09 ENCOUNTER — Other Ambulatory Visit

## 2024-02-09 DIAGNOSIS — Z0000 Encounter for general adult medical examination without abnormal findings: Secondary | ICD-10-CM

## 2024-02-09 DIAGNOSIS — M810 Age-related osteoporosis without current pathological fracture: Secondary | ICD-10-CM | POA: Diagnosis not present

## 2024-02-09 DIAGNOSIS — E039 Hypothyroidism, unspecified: Secondary | ICD-10-CM

## 2024-02-09 DIAGNOSIS — G0439 Other acute necrotizing hemorrhagic encephalopathy: Secondary | ICD-10-CM | POA: Diagnosis not present

## 2024-02-09 NOTE — Progress Notes (Signed)
 Lab only

## 2024-02-10 LAB — COMPREHENSIVE METABOLIC PANEL
AG Ratio: 2.1 (calc) (ref 1.0–2.5)
ALT: 16 U/L (ref 6–29)
AST: 27 U/L (ref 10–35)
Albumin: 4.4 g/dL (ref 3.6–5.1)
Alkaline phosphatase (APISO): 59 U/L (ref 37–153)
BUN: 15 mg/dL (ref 7–25)
CO2: 29 mmol/L (ref 20–32)
Calcium: 10.1 mg/dL (ref 8.6–10.4)
Chloride: 102 mmol/L (ref 98–110)
Creat: 0.72 mg/dL (ref 0.60–1.00)
Globulin: 2.1 g/dL (ref 1.9–3.7)
Glucose, Bld: 94 mg/dL (ref 65–99)
Potassium: 4.8 mmol/L (ref 3.5–5.3)
Sodium: 139 mmol/L (ref 135–146)
Total Bilirubin: 1.5 mg/dL — ABNORMAL HIGH (ref 0.2–1.2)
Total Protein: 6.5 g/dL (ref 6.1–8.1)
eGFR: 88 mL/min/{1.73_m2} (ref >=60)

## 2024-02-10 LAB — CBC WITH DIFFERENTIAL/PLATELET
Absolute Lymphocytes: 1432 {cells}/uL (ref 850–3900)
Absolute Monocytes: 318 {cells}/uL (ref 200–950)
Basophils Absolute: 22 {cells}/uL (ref 0–200)
Basophils Relative: 0.5 %
Eosinophils Absolute: 52 {cells}/uL (ref 15–500)
Eosinophils Relative: 1.2 %
HCT: 40.3 % (ref 35.0–45.0)
Hemoglobin: 13.7 g/dL (ref 11.7–15.5)
MCH: 31.4 pg (ref 27.0–33.0)
MCHC: 34 g/dL (ref 32.0–36.0)
MCV: 92.4 fL (ref 80.0–100.0)
MPV: 10.6 fL (ref 7.5–12.5)
Monocytes Relative: 7.4 %
Neutro Abs: 2477 {cells}/uL (ref 1500–7800)
Neutrophils Relative %: 57.6 %
Platelets: 229 10*3/uL (ref 140–400)
RBC: 4.36 10*6/uL (ref 3.80–5.10)
RDW: 11.8 % (ref 11.0–15.0)
Total Lymphocyte: 33.3 %
WBC: 4.3 10*3/uL (ref 3.8–10.8)

## 2024-02-10 LAB — LIPID PANEL
Cholesterol: 205 mg/dL — ABNORMAL HIGH (ref <200)
HDL: 85 mg/dL (ref >=50)
LDL Cholesterol (Calc): 103 mg/dL — ABNORMAL HIGH
Non-HDL Cholesterol (Calc): 120 mg/dL (ref <130)
Total CHOL/HDL Ratio: 2.4 (calc) (ref <5.0)
Triglycerides: 77 mg/dL (ref <150)

## 2024-02-10 LAB — TSH: TSH: 1.61 m[IU]/L (ref 0.40–4.50)

## 2024-02-10 LAB — HEMOGLOBIN A1C
Hgb A1c MFr Bld: 5.4 %{Hb} (ref <5.7)
Mean Plasma Glucose: 108 mg/dL
eAG (mmol/L): 6 mmol/L

## 2024-02-13 ENCOUNTER — Other Ambulatory Visit: Payer: Medicare Other

## 2024-02-15 ENCOUNTER — Encounter: Payer: Self-pay | Admitting: Internal Medicine

## 2024-02-15 ENCOUNTER — Ambulatory Visit (INDEPENDENT_AMBULATORY_CARE_PROVIDER_SITE_OTHER): Payer: Medicare Other | Admitting: Internal Medicine

## 2024-02-15 VITALS — BP 96/60 | HR 71 | Temp 98.9°F | Resp 12 | Ht 63.25 in | Wt 119.0 lb

## 2024-02-15 DIAGNOSIS — M810 Age-related osteoporosis without current pathological fracture: Secondary | ICD-10-CM | POA: Diagnosis not present

## 2024-02-15 DIAGNOSIS — R1901 Right upper quadrant abdominal swelling, mass and lump: Secondary | ICD-10-CM | POA: Diagnosis not present

## 2024-02-15 DIAGNOSIS — Z0000 Encounter for general adult medical examination without abnormal findings: Secondary | ICD-10-CM | POA: Diagnosis not present

## 2024-02-15 DIAGNOSIS — Z8249 Family history of ischemic heart disease and other diseases of the circulatory system: Secondary | ICD-10-CM | POA: Diagnosis not present

## 2024-02-15 DIAGNOSIS — R109 Unspecified abdominal pain: Secondary | ICD-10-CM

## 2024-02-15 DIAGNOSIS — E039 Hypothyroidism, unspecified: Secondary | ICD-10-CM

## 2024-02-15 DIAGNOSIS — F5101 Primary insomnia: Secondary | ICD-10-CM

## 2024-02-15 LAB — POCT URINALYSIS DIP (MANUAL ENTRY)
Bilirubin, UA: NEGATIVE
Blood, UA: NEGATIVE
Glucose, UA: NEGATIVE mg/dL
Ketones, POC UA: NEGATIVE mg/dL
Leukocytes, UA: NEGATIVE
Nitrite, UA: NEGATIVE
Protein Ur, POC: NEGATIVE mg/dL
Spec Grav, UA: <=1.005 — AB (ref 1.010–1.025)
Urobilinogen, UA: 0.2 U/dL
pH, UA: 6 (ref 5.0–8.0)

## 2024-02-15 NOTE — Progress Notes (Signed)
 Annual Medicare Wellness Visit   Patient Care Team: Margaree Mackintosh, MD as PCP - General (Internal Medicine)  Visit Date: 02/15/24   Chief Complaint  Patient presents with   Annual Exam   Subjective:  Patient: Maria Perkins, Female DOB: 1951-04-05, 73 y.o. MRN: [id number] Maria Perkins is a 73 y.o. Female who presents today for her Annual Medicare Wellness Visit. Patient has history of Cervical Disc Disease, Fibrocystic Breast Disease, Migraine Headaches, Insomnia, Osteoporosis, Thyroid Disease.   Followed by Dr. Kathreen Cosier at [facility] for Hypothyroidism and Osteoporosis, who she last saw 11/13/2023. History of Hypothyroidism treated with Synthroid 112 mcg daily before breakfast. 02/09/2024 TSH: 1.61.    Has follow-up with she took bisphosphonates in the 1990s but had issues with peptic ulcer disease.  She was on Reclast for 3 years between 2015 and 2017.  Bone density study in July 2022 showed T-score of -2.5.  She restarted Reclast in August 2024.  She plays pickle ball.  Has had 2 falls while playing but no fractures.  Was given Reclast August 2024.  Takes vitamin D and calcium.   History of Insomnia treated with Ambien 5 mg at bedtime as needed.   Cardiac Calcium Score of 0 on 07/29/2022; showed mild aortic atherosclerosis. 02/09/2024 Lipid Panel, compared to 01/2023: Cholesterol 205, elevated from 202; LDL 103, elevated from 92.   On examination abdominal tenderness, right from the umbilicus noted.   Labs 02/09/2024 CBC: WNL CMP, compared to 01/2023: Total Bilirubin 1.5, decreased from 1.7.   HgbA1c: 5.4  Mammogram 10/30/2023 w/o mammographic evidence of malignancy and recommended repeat 2025.   Colonoscopy 04/23/2015 removed 1 sessile polyp from rectum, found to be a hyperplastic colonic polyp per pathology with recommended repeat 2026. Says that she was told that could be her last colonoscopy.    Bone Density 11/23/2022 T-score Femoral Neck -2.5, osteoporotic. No hx of  falls. Questionable avulsion fracture from the triquetrum on 04/25/2023. She is intolerant to oral bisphosphonates, but did try Reclast for 3 years, which she is thinking about starting again according to last year's note.   Vaccine Counseling: Due for Tdap; UTD on Flu, Shingles 2/2, and PNA - per patient Flu was given at pharmacy, will obtain date received. Past Medical History:  Diagnosis Date   Cervical disc disease    c5-c6-not recent    Fibrocystic breast disease in female    History of migraine headaches    Insomnia    Osteopenia    Thyroid disease    hypothyroidism  Medical/Surgical History Narrative:  Allergic/Intolerant of: Oral Bisphosphonates - initially tried bisphosphonates in the 1990s but developed Peptic Ulcer Disease, and then tried Reclast from 2015-2018.    1996 - diagnosed w/ a Cervical Disc Bulge C5-C6 treated with steroids and tractions; subsequently improved and never required surgery.  Family History  Problem Relation Age of Onset   Stroke Mother    Colon polyps Mother    Diabetes Father    Mental illness Father    Colon polyps Brother    Colon cancer Neg Hx    Esophageal cancer Neg Hx    Rectal cancer Neg Hx    Stomach cancer Neg Hx   Family History Narrative: Father, deceased w/ history of Diabetes and Dementia.  Mother deceased aged 51 from complications of a stroke that occurred December 2000. 2 brothers.   Social History   Social History Narrative   Married - husband is a former Programmer, multimedia of the [employer]  and [employer] and teaches at [employer]. 2 adult daughters. 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 Former smoker, for a couple of years in her late 22s, and social alcohol consumption. Exercises regularly by walking.    2023 - expecting a grandchild and will be traveling to [place] with her husband for that event.   2025 - Going to [place] in August to watch her  grandchild. Her daughter, Dahlia Client got married in 08/2023, and now she lives in [place].    Review of Systems  Constitutional:  Negative for chills, fever, malaise/fatigue and weight loss.  HENT:  Negative for hearing loss, sinus pain and sore throat.   Respiratory:  Negative for cough, hemoptysis and shortness of breath.   Cardiovascular:  Negative for chest pain, palpitations, leg swelling and PND.  Gastrointestinal:  Positive for abdominal pain. Negative for constipation, diarrhea, heartburn, nausea and vomiting.  Genitourinary:  Negative for dysuria, frequency and urgency.  Musculoskeletal:  Negative for back pain, myalgias and neck pain.  Skin:  Negative for itching and rash.  Neurological:  Negative for dizziness, tingling, seizures and headaches.  Endo/Heme/Allergies:  Negative for polydipsia.  Psychiatric/Behavioral:  Negative for depression. The patient is not nervous/anxious.     Objective:  Vitals: BP 96/60   Pulse 71   Temp 98.9 F (37.2 C) (Temporal)   Resp 12   Ht 5' 3.25" (1.607 m)   Wt 119 lb (54 kg)   SpO2 99%   BMI 20.91 kg/m  Physical Exam Vitals and nursing note reviewed.  Constitutional:      General: She is not in acute distress.    Appearance: Normal appearance. She is not ill-appearing or toxic-appearing.  HENT:     Head: Normocephalic and atraumatic.     Right Ear: Hearing, tympanic membrane, ear canal and external ear normal.     Left Ear: Hearing, tympanic membrane, ear canal and external ear normal.     Mouth/Throat:     Pharynx: Oropharynx is clear.  Eyes:     Extraocular Movements: Extraocular movements intact.     Pupils: Pupils are equal, round, and reactive to light.  Neck:     Thyroid: No thyroid mass, thyromegaly or thyroid tenderness.     Vascular: No carotid bruit.  Cardiovascular:     Rate and Rhythm: Normal rate and regular rhythm. No extrasystoles are present.    Pulses:          Dorsalis pedis pulses are 2+ on the right side and 2+  on the left side.     Heart sounds: Normal heart sounds. No murmur heard.    No friction rub. No gallop.  Pulmonary:     Effort: Pulmonary effort is normal.     Breath sounds: Normal breath sounds. No decreased breath sounds, wheezing, rhonchi or rales.  Chest:     Chest wall: No mass.  Abdominal:     Palpations: Abdomen is soft. There is mass (3 cm, noted on the right lateral-mid abdomen). There is no hepatomegaly or splenomegaly.     Tenderness: There is abdominal tenderness (right from umbilicus) in the periumbilical area.     Hernia: No hernia is present.     Comments: Mass could not be palpated on pelvic exam.   Musculoskeletal:     Cervical back: Normal range of motion.     Right lower leg: No edema.     Left lower leg:  No edema.  Lymphadenopathy:     Cervical: No cervical adenopathy.     Upper Body:     Right upper body: No supraclavicular adenopathy.     Left upper body: No supraclavicular adenopathy.  Skin:    General: Skin is warm and dry.  Neurological:     General: No focal deficit present.     Mental Status: She is alert and oriented to person, place, and time. Mental status is at baseline.     Sensory: Sensation is intact.     Motor: Motor function is intact. No weakness.     Deep Tendon Reflexes: Reflexes are normal and symmetric.  Psychiatric:        Attention and Perception: Attention normal.        Mood and Affect: Mood normal.        Speech: Speech normal.        Behavior: Behavior normal.        Thought Content: Thought content normal.        Cognition and Memory: Cognition normal.        Judgment: Judgment normal.   Most Recent Fall Risk Assessment:    02/15/2024   10:05 AM  Fall Risk   Falls in the past year? 0  Number falls in past yr: 0  Injury with Fall? 0  Risk for fall due to : No Fall Risks   Most Recent Depression Screenings:    02/15/2024   10:07 AM 09/04/2023    9:02 AM  PHQ 2/9 Scores  PHQ - 2 Score 0 0  PHQ- 9 Score 0    Most  Recent Cognitive Screening:    02/13/2023   10:17 AM  6CIT Screen  What Year? 0 points  What month? 0 points  What time? 0 points  Count back from 20 0 points  Months in reverse 0 points   Results:  Studies Obtained And Personally Reviewed By Me: Cardiac Calcium Score of 0 on 07/29/2022; showed mild aortic atherosclerosis.  Mammogram 10/30/2023 w/o mammographic evidence of malignancy.   Colonoscopy 04/23/2015 removed 1 sessile polyp from rectum, found to be a hyperplastic colonic polyp per pathology.   Bone Density 11/23/2022 T-score Femoral Neck -2.5, osteoporotic.  Labs:     Component Value Date/Time   NA 139 02/09/2024 0924   K 4.8 02/09/2024 0924   CL 102 02/09/2024 0924   CO2 29 02/09/2024 0924   GLUCOSE 94 02/09/2024 0924   BUN 15 02/09/2024 0924   CREATININE 0.72 02/09/2024 0924   CALCIUM 10.1 02/09/2024 0924   PROT 6.5 02/09/2024 0924   ALBUMIN 4.6 05/29/2017 0857   AST 27 02/09/2024 0924   ALT 16 02/09/2024 0924   ALKPHOS 55 05/29/2017 0857   BILITOT 1.5 (H) 02/09/2024 0924   GFRNONAA 84 02/08/2021 0936   GFRAA 98 02/08/2021 0936    Lab Results  Component Value Date   WBC 4.3 02/09/2024   HGB 13.7 02/09/2024   HCT 40.3 02/09/2024   MCV 92.4 02/09/2024   PLT 229 02/09/2024   Lab Results  Component Value Date   CHOL 205 (H) 02/09/2024   HDL 85 02/09/2024   LDLCALC 103 (H) 02/09/2024   TRIG 77 02/09/2024   CHOLHDL 2.4 02/09/2024   Lab Results  Component Value Date   HGBA1C 5.4 02/09/2024    Lab Results  Component Value Date   TSH 1.61 02/09/2024   Assessment & Plan:   Orders Placed This Encounter  Procedures   CT  ABDOMEN PELVIS W CONTRAST   POCT urinalysis dipstick  Other Labs Reviewed today: CBC: WNL CMP, compared to 01/2023: Total Bilirubin 1.5, decreased from 1.7.   HgbA1c: 5.4  Abdominal Tenderness, right from the umbilicus with palpable 3 cm mass noted on exam. Would like her to have CT of abdomen as soon as possible. Plan GI referral.  Had colonoscopy 2016 with Dr. Juanda Chance.  Followed by Dr. Kathreen Cosier at [facility] Endocrinology for Hypothyroidism and Osteoporosis, who she last saw 11/13/2023. Hypothyroidism treated with Synthroid 112 mcg daily before breakfast. 02/09/2024 TSH: 1.61. Bone Density 11/23/2022 T-score Femoral Neck -2.5, osteoporotic. No hx falls or fractures. She is intolerant to oral bisphosphonates.   Insomnia treated with Ambien 5 mg at bedtime as needed.   Cardiac Calcium Score of 0 on 07/29/2022; showed mild Aortic Atherosclerosis. Lipid Panel, compared to 01/2023: Cholesterol 205, elevated from 202; LDL 103, elevated from 92.   Mammogram 10/30/2023 w/o mammographic evidence of malignancy and recommended repeat 2025.   Colonoscopy 04/23/2015 removed 1 sessile polyp from rectum, found to be a hyperplastic colonic polyp per pathology with recommended repeat 2026. Says that she was told that could be her last colonoscopy.    Vaccine Counseling: Due for Tdap; UTD on Flu, Shingles 2/2, and PNA - per patient Flu was given at pharmacy, will obtain date received.   Annual wellness visit done today including the all of the following: Reviewed patient's Family Medical History Reviewed and updated list of patient's medical providers Assessment of cognitive impairment was done Assessed patient's functional ability Established a written schedule for health screening services Health Risk Assessent Completed and Reviewed  Discussed health benefits of physical activity, and encouraged her to engage in regular exercise appropriate for her age and condition.  Tender mass right lateral mid abdomen- obtain CT and make GI referral  Hypothyroidism- stable  Osteoporosis- stable has received reclast and seen by [facility]  Insomnia- longstanding. Has taken Ambien for years without sideeffects    I,Emily Lagle,acting as a scribe for Margaree Mackintosh, MD.,have documented all relevant documentation on the behalf of Margaree Mackintosh,  MD,as directed by  Margaree Mackintosh, MD while in the presence of Margaree Mackintosh, MD.   I, Margaree Mackintosh, MD, have reviewed all documentation for this visit. The documentation on 02/15/24 for the exam, diagnosis, procedures, and orders are all accurate and complete.

## 2024-02-15 NOTE — Patient Instructions (Signed)
 We have ordered CT of abdomen to evaluate tender mass in right mid abdomen. Will also put in GI referral. Continue follow up with Endocrinologist for hypothyroidism and osteoporosis management. Continue Ambien for longstanding history of insomnia. Has no side effects.

## 2024-02-20 ENCOUNTER — Ambulatory Visit
Admission: RE | Admit: 2024-02-20 | Discharge: 2024-02-20 | Disposition: A | Discharge status: Home / Self Care | Source: Ambulatory Visit | Attending: Internal Medicine | Admitting: Internal Medicine

## 2024-02-20 DIAGNOSIS — R109 Unspecified abdominal pain: Secondary | ICD-10-CM

## 2024-02-20 MED ORDER — IOPAMIDOL (ISOVUE-300) INJECTION 61%
100.0000 mL | Freq: Once | INTRAVENOUS | Status: AC | PRN
Start: 2024-02-20 — End: 2024-02-20
  Administered 2024-02-20: 100 mL via INTRAVENOUS

## 2024-02-27 NOTE — Progress Notes (Signed)
 Patient Care Team: Sylvan Evener, MD as PCP - General (Internal Medicine)  Visit Date: 02/29/24  Subjective:  No chief complaint on file.  BP Readings from Last 1 Encounters:  02/15/24 96/60   Patient ZO:XWRUE Maria Perkins,Female DOB:[birth year],73 y.o. AVW:[id number]   73 y.o. Female presents today for follow-up to discuss her CT Abdomen/Pelvis on 02/20/2024. At her annual visit she said that she was having some abdominal tenderness and on exam a 3 cm mass had been palpated. On April 1st she had an abdominal CT, which was normal except for some moderate residual fecal material throughout the colon, but no impaction or obstruction. Today is feeling well. No nausea, vomiting or diarrhea. No complaint of significant abdominal pain. Last colonoscopy was 2016 by Dr. Joshua Nieves.  Also has hypothyroidism and osteoporosis. Last T score 2022 was -2.5. She took oral bisphosphonates in the 1990s but developed stomach ulcer. Then was on Reclast for 3 years 2015-2017.In July 2022 bone density had T score -2.5. Started Reclast August 2024.Takes calcium and Vitamin D 2000 Units daily. She plays pickleball. Repeat bone density was -2.4 in LS spine and -1.8 total hip.Advised to continue Levothyroxine 0.112 mcg 6 days a week. Reclast is due August 2025.    Currently on Synthroid 0.112 mcg 6 days a week. Saw Dr. Geanie Keen, Endocrinologist at [facility] here in [place] on December 23,2024.  Vitamin D was normal.-met was normal. TSH was normal.  Dr. Amiel Balder is GYN physician. Seen in October 2024.Is on estrace vaginal cream.  Takes Ambien for sleep.    Past Medical History:  Diagnosis Date   Cervical disc disease    c5-c6-not recent    Fibrocystic breast disease in female    History of migraine headaches    Insomnia    Osteopenia    Thyroid disease    hypothyroidism  No Known Allergies  Family History  Problem Relation Age of Onset   Stroke Mother    Colon polyps Mother    Diabetes Father     Mental illness Father    Colon polyps Brother    Colon cancer Neg Hx    Esophageal cancer Neg Hx    Rectal cancer Neg Hx    Stomach cancer Neg Hx    Social History   Social History Narrative   Married - husband is a former Programmer, multimedia of the [employer] and [employer] and teaches at [employer]. 2 adult daughters. 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 Former smoker, for a couple of years in her late 38s, and social alcohol consumption. Exercises regularly by walking.    2023 - expecting a grandchild and will be traveling to [place] with her husband for that event.   2025 - Going to [place] in August to watch her grandchild. Her daughter, Alisa App got married in 08/2023, and now she lives in [place].    ROS   Objective:    Physical Exam Abdominal mass in right midaxillary line seems smaller than at last vivit but still has tenderness. No rebound. No hepatosplenomegaly. Results:  Studies Obtained And Personally Reviewed By Me:  Imaging, colonoscopy, mammogram, bone density scan, echocardiogram, heart cath, stress test, CT calcium score, etc.   Labs:     Component Value Date/Time   NA 139 02/09/2024 0924   K 4.8 02/09/2024 0924   CL 102 02/09/2024 0924   CO2 29 02/09/2024 0924   GLUCOSE 94 02/09/2024  0924   BUN 15 02/09/2024 0924   CREATININE 0.72 02/09/2024 0924   CALCIUM 10.1 02/09/2024 0924   PROT 6.5 02/09/2024 0924   ALBUMIN 4.6 05/29/2017 0857   AST 27 02/09/2024 0924   ALT 16 02/09/2024 0924   ALKPHOS 55 05/29/2017 0857   BILITOT 1.5 (H) 02/09/2024 0924   GFRNONAA 84 02/08/2021 0936   GFRAA 98 02/08/2021 0936    Lab Results  Component Value Date   WBC 4.3 02/09/2024   HGB 13.7 02/09/2024   HCT 40.3 02/09/2024   MCV 92.4 02/09/2024   PLT 229 02/09/2024   Lab Results  Component Value Date   CHOL 205 (H) 02/09/2024   HDL 85 02/09/2024   LDLCALC 103 (H) 02/09/2024   TRIG 77 02/09/2024    CHOLHDL 2.4 02/09/2024   Lab Results  Component Value Date   HGBA1C 5.4 02/09/2024    Lab Results  Component Value Date   TSH 1.61 02/09/2024     No results found for any visits on 03/01/24. Assessment & Plan:   Right mid clavicular line palpable abdominal mass smaller today than last visit. Slightly tender to touch. Etiology unclear. CT did not show mass in that region.   Refer to GI for evaluation. Would MRI with contrast be appropriate?  Hx of osteoporosis treated with Reclast next dose due August 2025. Bone density Jan 2024 T score -2.4 LS spine and -2.5 left femoral neck  Longstanding insomnia treated with Ambien. Has no side effects.  Hypothyroidism treated with Synthroid brand name  Postmenopausal treated with estrogen cream vaginally per GYN      I,Emily Lagle,acting as a scribe for Sylvan Evener, MD.,have documented all relevant documentation on the behalf of Sylvan Evener, MD,as directed by  Sylvan Evener, MD while in the presence of Sylvan Evener, MD.   I, Sylvan Evener, MD, have reviewed all documentation for this visit. The documentation on 03/03/24 for the exam, diagnosis, procedures, and orders are all accurate and complete.

## 2024-03-01 ENCOUNTER — Ambulatory Visit (INDEPENDENT_AMBULATORY_CARE_PROVIDER_SITE_OTHER): Admitting: Internal Medicine

## 2024-03-01 ENCOUNTER — Encounter: Payer: Self-pay | Admitting: Nurse Practitioner

## 2024-03-01 VITALS — BP 94/64 | HR 57 | Temp 98.7°F | Ht 63.25 in | Wt 118.0 lb

## 2024-03-01 DIAGNOSIS — R1901 Right upper quadrant abdominal swelling, mass and lump: Secondary | ICD-10-CM | POA: Diagnosis not present

## 2024-03-01 DIAGNOSIS — E039 Hypothyroidism, unspecified: Secondary | ICD-10-CM | POA: Diagnosis not present

## 2024-03-01 DIAGNOSIS — F5101 Primary insomnia: Secondary | ICD-10-CM | POA: Diagnosis not present

## 2024-03-01 DIAGNOSIS — M810 Age-related osteoporosis without current pathological fracture: Secondary | ICD-10-CM | POA: Diagnosis not present

## 2024-03-01 NOTE — Progress Notes (Addendum)
 Patient Care Team: Sylvan Evener, MD as PCP - General (Internal Medicine)  Visit Date: 03/01/24  Subjective:   Chief Complaint  Patient presents with   Results  Patient GN:FAOZH S Ostrander,Female DOB:09-04-1951,73 y.o. YQM:[id number]   73 y.o. Female presents today for follow-up to discuss her CT Abdomen/Pelvis on 02/20/2024. At her annual visit she said that she was having some abdominal tenderness and on exam a 3 cm mass had been palpated. On April 1st she had her abdominal CT, which was normal except for some moderate residual fecal material throughout the colon, but no impaction or obstruction. She says that she hasn't had any symptoms such as N/V/D or constipation, and up until today hadn't checked to see if she could still feel the mass but did palpate her abdomen after she was asked and confirmed that she could still feel the mass and some tenderness on palpation.   Past Medical History:  Diagnosis Date   Cervical disc disease    c5-c6-not recent    Fibrocystic breast disease in female    History of migraine headaches    Insomnia    Osteopenia    Thyroid disease    hypothyroidism  No Known Allergies  Family History  Problem Relation Age of Onset   Stroke Mother    Colon polyps Mother    Diabetes Father    Mental illness Father    Colon polyps Brother    Colon cancer Neg Hx    Esophageal cancer Neg Hx    Rectal cancer Neg Hx    Stomach cancer Neg Hx    Social History   Social History Narrative   Married - husband is a former Programmer, multimedia of the [employer] and [employer] and teaches at [employer]. 2 adult daughters. 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 Former smoker, for a couple of years in her late 29s, and social alcohol consumption. Exercises regularly by walking.    2023 - expecting a grandchild and will be traveling to [place] with her husband for that event.   2025 - Going to [place]  in August to watch her grandchild. Her daughter, Alisa App got married in 08/2023, and now she lives in [place].    Review of Systems  Gastrointestinal:  Negative for constipation, diarrhea, nausea and vomiting.  All other systems reviewed and are negative.    Objective:  Vitals: BP 94/64   Pulse (!) 57   Temp 98.7 F (37.1 C) (Temporal)   Ht 5' 3.25" (1.607 m)   Wt 118 lb (53.5 kg)   SpO2 96%   BMI 20.74 kg/m   Physical Exam Vitals and nursing note reviewed.  Constitutional:      General: She is not in acute distress.    Appearance: Normal appearance. She is not ill-appearing or toxic-appearing.  HENT:     Head: Normocephalic and atraumatic.     Right Ear: Hearing, tympanic membrane, ear canal and external ear normal.     Left Ear: Hearing, tympanic membrane, ear canal and external ear normal.     Mouth/Throat:     Pharynx: Oropharynx is clear.  Abdominal:     Palpations: Abdomen is soft. There is mass (noted on the right lateral-mid abdomen, seems smaller compared to last exam, which had been estimated to be 3 cm). There is no hepatomegaly or splenomegaly.     Tenderness: There is abdominal tenderness (right from umbilicus) in  the periumbilical area.     Hernia: No hernia is present.     Comments: Thickened area right of the umbilicus is still tender to palpation, but seems smaller and softer comparative to last exam  Skin:    General: Skin is warm and dry.  Neurological:     General: No focal deficit present.     Mental Status: She is alert and oriented to person, place, and time. Mental status is at baseline.     Sensory: Sensation is intact.     Motor: Motor function is intact. No weakness.     Deep Tendon Reflexes: Reflexes are normal and symmetric.  Psychiatric:        Attention and Perception: Attention normal.        Mood and Affect: Mood normal.        Speech: Speech normal.        Behavior: Behavior normal.        Thought Content: Thought content normal.         Cognition and Memory: Cognition normal.        Judgment: Judgment normal.     Results:  Studies Obtained And Personally Reviewed By Me:  CT ABDOMEN AND PELVIS WITH CONTRAST  FINDINGS: Lower chest: No infiltrates or consolidations, no pleural effusions   Hepatobiliary: Liver normal size no masses no biliary dilatation. Gallbladder unremarkable. No gallstones.   The right lobe of the liver appears prominent, this is probably secondary to the scoliosis and the mild rotation of the right kidney the right lobe of the liver measures 15 cm in longitudinal diameter without evidence of masses.   Pancreas: Pancreas normal size. No masses calcifications or inflammatory changes.   Spleen: Spleen normal size.  No masses.   Adrenals/Urinary Tract: Adrenal glands are normal size. Follow-up recommended. Kidneys are normal. No masses calcifications or hydronephrosis   Stomach/Bowel: No small or large bowel obstruction or inflammatory changes.   Moderate amount of residual fecal material throughout the colon without obstruction or constipation.   Vascular/Lymphatic: No significant vascular findings are present. No enlarged abdominal or pelvic lymph nodes.   Reproductive: .  No masses.   Bladder unremarkable.   Other: Anterior abdominal wall unremarkable without evidence of umbilical or inguinal hernias   Musculoskeletal: Visualized portion of the thoracolumbar spine and pelvic structures grossly unremarkable without evidence of fracture bony abnormalities or soft tissue masses. 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   IMPRESSION: Unremarkable CT abdomen and pelvis. No masses, no adenopathy no inflammatory changes. No evidence of appendicitis. Moderate amount of residual fecal material throughout the colon without impaction or obstruction.  Labs:     Component Value Date/Time   NA 139 02/09/2024 0924   K 4.8 02/09/2024 0924    CL 102 02/09/2024 0924   CO2 29 02/09/2024 0924   GLUCOSE 94 02/09/2024 0924   BUN 15 02/09/2024 0924   CREATININE 0.72 02/09/2024 0924   CALCIUM 10.1 02/09/2024 0924   PROT 6.5 02/09/2024 0924   ALBUMIN 4.6 05/29/2017 0857   AST 27 02/09/2024 0924   ALT 16 02/09/2024 0924   ALKPHOS 55 05/29/2017 0857   BILITOT 1.5 (H) 02/09/2024 0924   GFRNONAA 84 02/08/2021 0936   GFRAA 98 02/08/2021 0936    Lab Results  Component Value Date   WBC 4.3 02/09/2024   HGB 13.7 02/09/2024   HCT 40.3 02/09/2024   MCV 92.4 02/09/2024   PLT 229 02/09/2024   Lab Results  Component Value Date   CHOL 205 (H) 02/09/2024   HDL 85 02/09/2024   LDLCALC 103 (H) 02/09/2024   TRIG 77 02/09/2024   CHOLHDL 2.4 02/09/2024   Lab Results  Component Value Date   HGBA1C 5.4 02/09/2024    Lab Results  Component Value Date   TSH 1.61 02/09/2024   Assessment & Plan:   Abdominal Mass: despite benign appearing on Abd CT, there is still a thickened area right of her umbilicus that is tender to palpation, but it does feel smaller compared to last exam on March 27th. She denied having any constipation or N/V/D. Discussed referral to [facility] for evaluation and explained that she may need to have an MRI for better visualization and may need additional GI studies.   -Discussed with patient that still has tender area to the right of umbilicus, seems smaller compared to last exam thickened area is tender Refer to [facility] for evaluation explained she may also need to have endoscopy/colonoscopy, which she is agreeable no issues with constipation or N/V/D. May need an MRI instead as CT did not reveal mass that I have palpated. Mass does seem a bit smaller and remains tender.    I,Emily Lagle,acting as a Neurosurgeon for Sylvan Evener, MD.,have documented all relevant documentation on the behalf of Sylvan Evener, MD,as directed by  Sylvan Evener, MD while in the presence of Sylvan Evener, MD.   I, Sylvan Evener, MD,  have reviewed all documentation for this visit. The documentation on 03/03/24 for the exam, diagnosis, procedures, and orders are all accurate and complete.

## 2024-03-03 ENCOUNTER — Encounter: Payer: Self-pay | Admitting: Internal Medicine

## 2024-03-03 NOTE — Patient Instructions (Addendum)
 Patient being referred to [facility] for evaluation of RUQ palpable abdominal mass not seen on CT but palpable and slightly tender.

## 2024-03-04 ENCOUNTER — Encounter: Payer: Self-pay | Admitting: Internal Medicine

## 2024-03-04 ENCOUNTER — Telehealth: Payer: Self-pay | Admitting: *Deleted

## 2024-03-04 NOTE — Telephone Encounter (Signed)
 Copied from CRM [id number]. Topic: Referral - Question >> Mar 01, 2024  4:21 PM Maria Perkins wrote: Reason for CRM: Patient called wanted to make provider aware that she contacted Maria Perkins and the 1st appt she could due was 6/9 and she will be out of town. She scheduled with them for 6/23. Patient states that 2 1/2 months away and wanted to make sure that was ok with her provider. Can contact her via phone or MyChart if needed. Thank You

## 2024-03-06 ENCOUNTER — Other Ambulatory Visit

## 2024-04-08 DIAGNOSIS — M25529 Pain in unspecified elbow: Secondary | ICD-10-CM | POA: Diagnosis not present

## 2024-04-08 DIAGNOSIS — M542 Cervicalgia: Secondary | ICD-10-CM | POA: Diagnosis not present

## 2024-04-08 DIAGNOSIS — M25519 Pain in unspecified shoulder: Secondary | ICD-10-CM | POA: Diagnosis not present

## 2024-04-10 DIAGNOSIS — M25529 Pain in unspecified elbow: Secondary | ICD-10-CM | POA: Diagnosis not present

## 2024-04-10 DIAGNOSIS — M542 Cervicalgia: Secondary | ICD-10-CM | POA: Diagnosis not present

## 2024-04-10 DIAGNOSIS — M25519 Pain in unspecified shoulder: Secondary | ICD-10-CM | POA: Diagnosis not present

## 2024-04-12 DIAGNOSIS — M542 Cervicalgia: Secondary | ICD-10-CM | POA: Diagnosis not present

## 2024-04-12 DIAGNOSIS — M25529 Pain in unspecified elbow: Secondary | ICD-10-CM | POA: Diagnosis not present

## 2024-04-12 DIAGNOSIS — M25519 Pain in unspecified shoulder: Secondary | ICD-10-CM | POA: Diagnosis not present

## 2024-04-17 DIAGNOSIS — M542 Cervicalgia: Secondary | ICD-10-CM | POA: Diagnosis not present

## 2024-04-17 DIAGNOSIS — M25529 Pain in unspecified elbow: Secondary | ICD-10-CM | POA: Diagnosis not present

## 2024-04-17 DIAGNOSIS — M25519 Pain in unspecified shoulder: Secondary | ICD-10-CM | POA: Diagnosis not present

## 2024-05-08 NOTE — Progress Notes (Signed)
 05/08/2024 Maria Perkins [id number] 1951/04/03   CHIEF COMPLAINT: Mass to the right abdomen   HISTORY OF PRESENT ILLNESS: Maria Perkins is a 73 year old female with a past medical history of hypothyroidism, cervical disc disease, fibrocystic breast disease, migraine headaches, osteoporosis and colon polyps. She presents to our office today as referred by Dr. Ronal Hailstone for further evaluation regarding a small tender palpable mass right of the umbilicus which was found on routine exam 2 1/2 months ago. CTAP 02/21/2024 did not identify any abdominal wall mass or hernia but noted stool throughout the colon. Patient stated she was unaware of any mass or pain to the right mid abdomen prior to this finding on exam by Dr. Hailstone. She denies having any constipation. She typically passes a normal formed brown bowel movement daily.  No bloody or black stools.  She endorsed having a significantly distended gaseous abdomen less than 2 months ago which abated after she took an antibiotic for 10 days prescribed for a tooth infection. No further abdominal bloat since then. Mother possibly had colon polyps but further details are unclear.  No known family history of colorectal cancer.  No fevers or night sweats.  Weight is stable.     Latest Ref Rng & Units 02/09/2024    9:24 AM 02/10/2023    9:13 AM 02/07/2022   10:04 AM  CBC  WBC 3.8 - 10.8 Thousand/uL 4.3  3.8  3.9   Hemoglobin 11.7 - 15.5 g/dL 86.2  86.1  85.6   Hematocrit 35.0 - 45.0 % 40.3  41.2  43.1   Platelets 140 - 400 Thousand/uL 229  211  198         Latest Ref Rng & Units 02/09/2024    9:24 AM 02/10/2023    9:13 AM 02/07/2022   10:04 AM  CMP  Glucose 65 - 99 mg/dL 94  93  97   BUN 7 - 25 mg/dL 15  18  16    Creatinine 0.60 - 1.00 mg/dL 9.27  9.25  9.26   Sodium 135 - 146 mmol/L 139  140  141   Potassium 3.5 - 5.3 mmol/L 4.8  4.9  4.8   Chloride 98 - 110 mmol/L 102  103  103   CO2 20 - 32 mmol/L 29  29  31    Calcium 8.6 - 10.4  mg/dL 89.8  9.8  89.6   Total Protein 6.1 - 8.1 g/dL 6.5  6.5  6.9   Total Bilirubin 0.2 - 1.2 mg/dL 1.5  1.7  1.9   AST 10 - 35 U/L 27  36  26   ALT 6 - 29 U/L 16  17  16      CTAP with oral and IV contrast 02/21/2024: FINDINGS: Lower chest: No infiltrates or consolidations, no pleural effusions   Hepatobiliary: Liver normal size no masses no biliary dilatation. Gallbladder unremarkable. No gallstones.   The right lobe of the liver appears prominent, this is probably secondary to the scoliosis and the mild rotation of the right kidney the right lobe of the liver measures 15 cm in longitudinal diameter without evidence of masses.   Pancreas: Pancreas normal size. No masses calcifications or inflammatory changes.   Spleen: Spleen normal size.  No masses.   Adrenals/Urinary Tract: Adrenal glands are normal size. Follow-up recommended. Kidneys are normal. No masses calcifications or hydronephrosis   Stomach/Bowel: No small or large bowel obstruction or inflammatory changes.   Moderate amount of residual  fecal material throughout the colon without obstruction or constipation.   Vascular/Lymphatic: No significant vascular findings are present. No enlarged abdominal or pelvic lymph nodes.   Reproductive: No masses.   Bladder unremarkable.   Other: Anterior abdominal wall unremarkable without evidence of umbilical or inguinal hernias   Musculoskeletal: Visualized portion of the thoracolumbar spine and pelvic structures grossly unremarkable without evidence of fracture bony abnormalities or soft tissue masses. 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   IMPRESSION: Unremarkable CT abdomen and pelvis. No masses, no adenopathy no inflammatory changes. No evidence of appendicitis. Moderate amount of residual fecal material throughout the colon without impaction or obstruction.   GI PROCEDURES:  Colonoscopy 12/24/2009: One flat  3mm polyp removed from the right colon  1. COLON, POLYP(S), RIGHT : TUBULAR ADENOMA. NO HIGH GRADE DYSPLASIA OR   MALIGNANCY IDENTIFIED.   Colonoscopy by Dr. Obie 04/23/2015: 3 mm polyp removed from the rectum  Surgical [P], rectum, polyp 10 year recall colonoscopy  -HYPERPLASTIC COLONIC POLYP. -NO ADENOMATOUS CHANGE OR MALIGNANCY IDENTIFIED.  Past Medical History:  Diagnosis Date   Cervical disc disease    c5-c6-not recent    Fibrocystic breast disease in female    History of migraine headaches    Insomnia    Osteopenia    Thyroid  disease    hypothyroidism   Past Surgical History:  Procedure Laterality Date   COLONOSCOPY     meniscal tear     rt knee dr duda   POLYPECTOMY     UPPER GASTROINTESTINAL ENDOSCOPY     Social History: She is married.  She has 2 daughters.  Self-employed.  Stopped smoking cigarettes in 1973.  She drinks 1 alcoholic drink daily or less.  No drug use.  Family History: Mother with history of a stroke and possibly had colon polyps.  Brother with history of colon polyps.  Father with diabetes and mental illness.  No known family history of colorectal cancer.   No Known Allergies   Outpatient Encounter Medications as of 05/13/2024  Medication Sig   calcium-vitamin D  (OSCAL WITH D) 500-5 MG-MCG tablet Take 1 tablet by mouth.   estradiol  (ESTRACE ) 0.1 MG/GM vaginal cream Place 1 gram vaginally twice weekly or apply small externally 3-4 times weekly   Multiple Vitamin (MULTIVITAMIN) tablet Take 1 tablet by mouth daily.   Multiple Vitamins-Minerals (PRESERVISION AREDS PO) Take by mouth.   SYNTHROID  112 MCG tablet TAKE 1 TABLET ONCE DAILY BEFORE BREAKFAST.   zolpidem  (AMBIEN ) 5 MG tablet TAKE ONE TABLET BY MOUTH AT BEDTIME AS NEEDED FOR `SLEEP.   No facility-administered encounter medications on file as of 05/13/2024.   REVIEW OF SYSTEMS:  Gen: Denies fever, sweats or chills. No weight loss.  CV: Denies chest pain, palpitations or edema. Resp: Denies  cough, shortness of breath of hemoptysis.  GI:See HPI. No GERD symptoms.  GU: + Urine leakage.  MS: + Scoliosis, back pain. Derm: Denies rash, itchiness, skin lesions or unhealing ulcers. Psych: Denies depression, anxiety, memory loss or confusion. Heme: Denies bruising, easy bleeding. Neuro:  Denies headaches, dizziness or paresthesias. Endo:  Denies any problems with DM, thyroid  or adrenal function.  PHYSICAL EXAM: BP 104/72   Pulse 64   Ht 5' 3.25 (1.607 m)   Wt 116 lb (52.6 kg)   BMI 20.39 kg/m  Wt Readings from Last 3 Encounters:  05/13/24 116 lb (52.6 kg)  03/01/24 118 lb (53.5 kg)  02/15/24 119 lb (54 kg)    General:  73 year old female in no acute distress. Head: Normocephalic and atraumatic. Eyes:  Sclerae non-icteric, conjunctive pink. Ears: Normal auditory acuity. Mouth: Dentition intact. No ulcers or lesions.  Neck: Supple, no lymphadenopathy or thyromegaly.  Lungs: Clear bilaterally to auscultation without wheezes, crackles or rhonchi. Heart: Regular rate and rhythm. No murmur, rub or gallop appreciated.  Abdomen: Soft, nontender, nondistended. No mass or abdominal wall hernia palpated when supine and when abdominal wall muscles tensed. When sitting upright, a mildly tender small area of thickness with a small ridge without a discrete mass was palpated to the right mid abdomen, area or right colon, ? stool.  No hepatosplenomegaly. Normoactive bowel sounds x 4 quadrants.  Rectal: Deferred.  Musculoskeletal: Symmetrical with no gross deformities. Skin: Warm and dry. No rash or lesions on visible extremities. Extremities: No edema. Neurological: Alert oriented x 4, no focal deficits.  Psychological: Alert and cooperative. Normal mood and affect.  ASSESSMENT AND PLAN:  73 year old female with questionable thickness to the right mid abdominal wall vs right colon without a discrete palpable mass. CTAP 02/2024 showed stool throughout the colon without identifying a colon  mass or abdominal wall hernia.  -Colonoscopy benefits and risks discussed including risk with sedation, risk of bleeding, perforation and infection  -If colonoscopy unrevealing, consider abdominal MRI -Miralax  Q HS to increase stool output   History of colon polyps. Colonoscopy 12/2009 identified one flat 3 mm tubular adenomatous polyp removed from the right colon.  Colonoscopy 04/2015 identified one 3 mm hyperplastic polyp removed from the rectum. Brother with history of colon polyps which required surgical intervention.   -Colonoscopy as ordered above   Elevated T. Bilirubin level with normal Alk phos, AST/ALT levels. 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 CTAP 02/2024 showed a normal gallbladder.  -Recommend hepatic panel with indirect/indirect bilirubin levels with PCP next lab draw          CC:  Perri Ronal PARAS, MD

## 2024-05-12 DIAGNOSIS — M25529 Pain in unspecified elbow: Secondary | ICD-10-CM | POA: Diagnosis not present

## 2024-05-12 DIAGNOSIS — M542 Cervicalgia: Secondary | ICD-10-CM | POA: Diagnosis not present

## 2024-05-12 DIAGNOSIS — M25519 Pain in unspecified shoulder: Secondary | ICD-10-CM | POA: Diagnosis not present

## 2024-05-13 ENCOUNTER — Encounter: Payer: Self-pay | Admitting: Nurse Practitioner

## 2024-05-13 ENCOUNTER — Ambulatory Visit: Admitting: Nurse Practitioner

## 2024-05-13 VITALS — BP 104/72 | HR 64 | Ht 63.25 in | Wt 116.0 lb

## 2024-05-13 DIAGNOSIS — R198 Other specified symptoms and signs involving the digestive system and abdomen: Secondary | ICD-10-CM | POA: Diagnosis not present

## 2024-05-13 DIAGNOSIS — M542 Cervicalgia: Secondary | ICD-10-CM | POA: Diagnosis not present

## 2024-05-13 DIAGNOSIS — M25529 Pain in unspecified elbow: Secondary | ICD-10-CM | POA: Diagnosis not present

## 2024-05-13 DIAGNOSIS — R17 Unspecified jaundice: Secondary | ICD-10-CM

## 2024-05-13 DIAGNOSIS — M25519 Pain in unspecified shoulder: Secondary | ICD-10-CM | POA: Diagnosis not present

## 2024-05-13 DIAGNOSIS — Z860101 Personal history of adenomatous and serrated colon polyps: Secondary | ICD-10-CM

## 2024-05-13 DIAGNOSIS — Z860100 Personal history of colon polyps, unspecified: Secondary | ICD-10-CM

## 2024-05-13 MED ORDER — NA SULFATE-K SULFATE-MG SULF 17.5-3.13-1.6 GM/177ML PO SOLN: 1.0000 | Freq: Once | ORAL | 0 refills | Status: AC

## 2024-05-13 NOTE — Patient Instructions (Addendum)
 Contact your primary care provider if the the mass right of your your belly button recurs. I recommend general surgery evaluation for possible biopsy if your right abdominal mass recurs.   You have been scheduled for a colonoscopy. Please follow written instructions given to you at your visit today.   If you use inhalers (even only as needed), please bring them with you on the day of your procedure.  DO NOT TAKE 7 DAYS PRIOR TO TEST- Trulicity (dulaglutide) Ozempic, Wegovy (semaglutide) Mounjaro (tirzepatide) Bydureon Bcise (exanatide extended release)  DO NOT TAKE 1 DAY PRIOR TO YOUR TEST Rybelsus (semaglutide) Adlyxin (lixisenatide) Victoza (liraglutide) Byetta (exanatide) ___________________________________________________________________________  Please follow up sooner if symptoms increase or worsen   Due to recent changes in healthcare laws, you may see the results of your imaging and laboratory studies on MyChart before your provider has had a chance to review them.  We understand that in some cases there may be results that are confusing or concerning to you. Not all laboratory results come back in the same time frame and the provider may be waiting for multiple results in order to interpret others.  Please give us  48 hours in order for your provider to thoroughly review all the results before contacting the office for clarification of your results.   Thank you for trusting me with your gastrointestinal care!   Elida Shawl, NP _______________________________________________________  If your blood pressure at your visit was 140/90 or greater, please contact your primary care physician to follow up on this.  _______________________________________________________  If you are age 48 or older, your body mass index should be between 23-30. Your Body mass index is 20.39 kg/m. If this is out of the aforementioned range listed, please consider follow up with your Primary  Care Provider.  If you are age 91 or younger, your body mass index should be between 19-25. Your Body mass index is 20.39 kg/m. If this is out of the aformentioned range listed, please consider follow up with your Primary Care Provider.   ________________________________________________________  The [facility] providers would like to encourage you to use MYCHART to communicate with providers for non-urgent requests or questions.  Due to long hold times on the telephone, sending your provider a message by [facility] may be a faster and more efficient way to get a response.  Please allow 48 business hours for a response.  Please remember that this is for non-urgent requests.  _______________________________________________________

## 2024-05-14 ENCOUNTER — Telehealth: Payer: Self-pay

## 2024-05-14 NOTE — Progress Notes (Signed)
 Agree with the assessment and plan as outlined by Elida Shawl, NP.  Suspect palpable mass either to be liver or stool burden in transverse colon based on review of CT.  MRI likely to be helpful, as CT should be be able to identify any intra-abdominal mass lesion that would be palpable on exam.  Agree with colonoscopy for polyp surveillance.   Jasmia Angst E. Stacia, MD [facility]

## 2024-05-14 NOTE — Telephone Encounter (Signed)
 Pharmacy Patient Advocate Encounter   Received notification from CoverMyMeds that prior authorization for Suprep Bowel Prep Kit 17.5-3.13-1.6GM/177ML solution is required/requested.   Insurance verification completed.   The patient is insured through [employer].   Per test claim: PA required; PA submitted to above mentioned insurance via CoverMyMeds Key/confirmation #/EOC [id number] Status is pending

## 2024-05-15 DIAGNOSIS — M25529 Pain in unspecified elbow: Secondary | ICD-10-CM | POA: Diagnosis not present

## 2024-05-15 DIAGNOSIS — M25519 Pain in unspecified shoulder: Secondary | ICD-10-CM | POA: Diagnosis not present

## 2024-05-15 DIAGNOSIS — M542 Cervicalgia: Secondary | ICD-10-CM | POA: Diagnosis not present

## 2024-05-15 NOTE — Telephone Encounter (Signed)
 Pharmacy Patient Advocate Encounter  Received notification from [facility] that Prior Authorization for Suprep Bowel Prep Kit 17.5-3.13-1.6GM/177ML solution has been DENIED.  Full denial letter will be uploaded to the media tab. See denial reason below.  We denied coverage for this drug because Suprep has not met Non-Formulary Exception criteria requirements. Based on the information provided by your doctor, at least three alternative formulary medications in the same drug class or category (including the generic equivalent, if available) have not been tried.  In this case, only one of the alternative medications has been tried: Sodium sulfate/Potassium sulfate/Magnesium sulfate solution (generic Suprep). Alternative medications include : Gavilyte-C, Gavilyte-G, Gavilyte-N, Sutab, etc.  PA #/Case ID/Reference #: [id number]

## 2024-05-31 DIAGNOSIS — M25529 Pain in unspecified elbow: Secondary | ICD-10-CM | POA: Diagnosis not present

## 2024-05-31 DIAGNOSIS — M25519 Pain in unspecified shoulder: Secondary | ICD-10-CM | POA: Diagnosis not present

## 2024-05-31 DIAGNOSIS — M542 Cervicalgia: Secondary | ICD-10-CM | POA: Diagnosis not present

## 2024-06-10 DIAGNOSIS — M25529 Pain in unspecified elbow: Secondary | ICD-10-CM | POA: Diagnosis not present

## 2024-06-10 DIAGNOSIS — M542 Cervicalgia: Secondary | ICD-10-CM | POA: Diagnosis not present

## 2024-06-10 DIAGNOSIS — M25519 Pain in unspecified shoulder: Secondary | ICD-10-CM | POA: Diagnosis not present

## 2024-06-12 ENCOUNTER — Encounter: Payer: Self-pay | Admitting: Internal Medicine

## 2024-06-12 ENCOUNTER — Ambulatory Visit
Admission: RE | Admit: 2024-06-12 | Discharge: 2024-06-12 | Disposition: A | Discharge status: Home / Self Care | Source: Ambulatory Visit | Attending: Internal Medicine | Admitting: Internal Medicine

## 2024-06-12 ENCOUNTER — Ambulatory Visit (INDEPENDENT_AMBULATORY_CARE_PROVIDER_SITE_OTHER): Admitting: Internal Medicine

## 2024-06-12 ENCOUNTER — Ambulatory Visit: Payer: Self-pay | Admitting: Internal Medicine

## 2024-06-12 VITALS — BP 100/70 | HR 60 | Temp 98.1°F | Ht 63.25 in | Wt 118.0 lb

## 2024-06-12 DIAGNOSIS — R198 Other specified symptoms and signs involving the digestive system and abdomen: Secondary | ICD-10-CM

## 2024-06-12 DIAGNOSIS — R1915 Other abnormal bowel sounds: Secondary | ICD-10-CM | POA: Diagnosis not present

## 2024-06-12 LAB — POCT URINALYSIS DIP (CLINITEK)
Bilirubin, UA: NEGATIVE
Blood, UA: NEGATIVE
Glucose, UA: NEGATIVE mg/dL
Ketones, POC UA: NEGATIVE mg/dL
Leukocytes, UA: NEGATIVE
Nitrite, UA: NEGATIVE
POC PROTEIN,UA: NEGATIVE
Spec Grav, UA: 1.01 (ref 1.010–1.025)
Urobilinogen, UA: 0.2 U/dL
pH, UA: 6 (ref 5.0–8.0)

## 2024-06-12 LAB — CBC WITH DIFFERENTIAL/PLATELET
Absolute Lymphocytes: 1669 {cells}/uL (ref 850–3900)
Absolute Monocytes: 369 {cells}/uL (ref 200–950)
Basophils Absolute: 42 {cells}/uL (ref 0–200)
Basophils Relative: 0.8 %
Eosinophils Absolute: 78 {cells}/uL (ref 15–500)
Eosinophils Relative: 1.5 %
HCT: 45 % (ref 35.0–45.0)
Hemoglobin: 14.5 g/dL (ref 11.7–15.5)
MCH: 30.8 pg (ref 27.0–33.0)
MCHC: 32.2 g/dL (ref 32.0–36.0)
MCV: 95.5 fL (ref 80.0–100.0)
MPV: 10.5 fL (ref 7.5–12.5)
Monocytes Relative: 7.1 %
Neutro Abs: 3042 {cells}/uL (ref 1500–7800)
Neutrophils Relative %: 58.5 %
Platelets: 245 Thousand/uL (ref 140–400)
RBC: 4.71 Million/uL (ref 3.80–5.10)
RDW: 12.2 % (ref 11.0–15.0)
Total Lymphocyte: 32.1 %
WBC: 5.2 Thousand/uL (ref 3.8–10.8)

## 2024-06-12 MED ORDER — AMOXICILLIN 500 MG PO CAPS
500.0000 mg | ORAL_CAPSULE | Freq: Three times a day (TID) | ORAL | 0 refills | Status: AC
Start: 2024-06-12 — End: 2024-06-22

## 2024-06-12 NOTE — Patient Instructions (Addendum)
 Stay with clear liquids for 24-48 hours and advance diet slowly.  Avoid foods that produce intestinal gas like beans.Xray shows no obstruction.

## 2024-06-12 NOTE — Telephone Encounter (Signed)
Would you like to do a video visit?

## 2024-06-12 NOTE — Progress Notes (Signed)
   Subjective:    Patient ID: Maria Perkins, female    DOB: 1951-01-28, 73 y.o.   MRN: [id number]  HPI  Patient was at the beach in late May around [facility] Day and began to have abdominal distention and jaw pain. She contacted her dentist and located  a dentist in [place]. Had Xrays there at dental office. Was placed on Amoxicillin  for 10 days. Back left molar was sensitive. Patient improved within within 2 days and pain was gone. Says abdominal distention improved as well. Says she has been feeling well since then until yesterday. Has noticed increased bowel sounds and some distension. Is planning to travel to [place] next week and is concerned symptoms may worsen.  Patient was seen in late June at [facility] by Elida Shawl for evaluation of episode of gaseous distension and pain during beach trip in May. Abd CT in April moderate amt of fecal material. Dr. Glendia Holt also consulted and felt there could be stool burden in transverse colon. Colonoscopy was suggested.Colonoscopy set up for August 25.  Had CT abdomen which ordered in April regarding a palpable mass I felt right lower quadrant with pain.Last colonoscopy was 2016. CT showed moderate amount of fecal material in colon.  Yesterday around lunch time, she  felt ill with nausea with no vomiting or diarrhea. Has noticed  again noticed some mild bowel sounds intermittently.  No fever or chills.  Patient feels that the amoxicillin  she was given in May certainly helped her symptoms at that point and would like to have a prescription with her on an upcoming trip to [place] next week.  I stressed to her that we needed to find out the reason for these recurring symptoms.  I think colonoscopy is a good idea.  She feels strongly that she needs to make the trip to [place] to visit family.  We checked dipstick UA today which was normal and drew CBC with differential which is pending.  Abdominal film today showed normal bowel gas  pattern without evidence of obstruction or dilatation.   Review of Systems see above.  She is in no acute distress but anxious about what is going on     Objective:   Physical Exam  Blood pressure 100/70, pulse 60 and regular, temperature is 98.1 degrees.  Weight 118 pounds.  Pulse oximetry 97% on room air.  BMI is 20.74.  Skin: Warm and dry.  Chest clear.  Cardiac exam: Regular rate and rhythm without ectopy.  Abdomen seems to be very slightly distended but not firm.  No hepatosplenomegaly appreciated.  There appears to be some mild tenderness in the left    Assessment & Plan:  She has intermittent increased bowel sounds. Abdomen is soft and she is not in excessive pain. I have ordered KUB to check for obstruction and drew CBC with diff. Offered to contact GI but she declines at this time. I did give her Rx for Amoxiciliin 500 mg 3 times daily for go days to take to [place] next week.

## 2024-06-13 DIAGNOSIS — D0361 Melanoma in situ of right upper limb, including shoulder: Secondary | ICD-10-CM | POA: Diagnosis not present

## 2024-06-13 DIAGNOSIS — D044 Carcinoma in situ of skin of scalp and neck: Secondary | ICD-10-CM | POA: Diagnosis not present

## 2024-06-13 DIAGNOSIS — D225 Melanocytic nevi of trunk: Secondary | ICD-10-CM | POA: Diagnosis not present

## 2024-06-13 DIAGNOSIS — L570 Actinic keratosis: Secondary | ICD-10-CM | POA: Diagnosis not present

## 2024-06-13 DIAGNOSIS — Z85828 Personal history of other malignant neoplasm of skin: Secondary | ICD-10-CM | POA: Diagnosis not present

## 2024-06-13 DIAGNOSIS — L821 Other seborrheic keratosis: Secondary | ICD-10-CM | POA: Diagnosis not present

## 2024-06-14 DIAGNOSIS — M25519 Pain in unspecified shoulder: Secondary | ICD-10-CM | POA: Diagnosis not present

## 2024-06-14 DIAGNOSIS — M542 Cervicalgia: Secondary | ICD-10-CM | POA: Diagnosis not present

## 2024-06-14 DIAGNOSIS — M25529 Pain in unspecified elbow: Secondary | ICD-10-CM | POA: Diagnosis not present

## 2024-06-15 ENCOUNTER — Other Ambulatory Visit: Payer: Self-pay | Admitting: Internal Medicine

## 2024-07-05 ENCOUNTER — Encounter: Payer: Self-pay | Admitting: Gastroenterology

## 2024-07-15 ENCOUNTER — Encounter: Payer: Self-pay | Admitting: Gastroenterology

## 2024-07-15 ENCOUNTER — Ambulatory Visit: Admitting: Gastroenterology

## 2024-07-15 VITALS — BP 108/56 | HR 66 | Temp 97.9°F | Resp 10 | Ht 63.25 in | Wt 116.0 lb

## 2024-07-15 DIAGNOSIS — Z860101 Personal history of adenomatous and serrated colon polyps: Secondary | ICD-10-CM

## 2024-07-15 DIAGNOSIS — Z1211 Encounter for screening for malignant neoplasm of colon: Secondary | ICD-10-CM

## 2024-07-15 DIAGNOSIS — Z860100 Personal history of colon polyps, unspecified: Secondary | ICD-10-CM

## 2024-07-15 DIAGNOSIS — D123 Benign neoplasm of transverse colon: Secondary | ICD-10-CM | POA: Diagnosis not present

## 2024-07-15 MED ORDER — SODIUM CHLORIDE 0.9 % IV SOLN: 500.0000 mL | Freq: Once | INTRAVENOUS | Status: DC

## 2024-07-15 NOTE — Progress Notes (Signed)
 [facility] History and Physical   Primary Care Physician:  Perri Ronal PARAS, MD   Reason for Procedure:   History of colon polyps  Plan:    Surveillance colonoscopy     HPI: Maria Perkins is a 73 y.o. female undergoing surveillance colonoscopy.  She had a colonoscopy in 2011 in which a tubular adenoma was removed.  A second colonoscopy in 2016 showed only a hyperplastic polyp.  She has had abdominal bloating recently and a questionable abdominal mass was palpated.  Subsequent CT was unremarkable.  Her brother was diagnosed with colon cancer in his 91s.         Past Medical History:  Diagnosis Date   Cervical disc disease    c5-c6-not recent    Fibrocystic breast disease in female    History of migraine headaches    Insomnia    Osteopenia    Osteoporosis    Thyroid  disease    hypothyroidism    Past Surgical History:  Procedure Laterality Date   COLONOSCOPY     meniscal tear     rt knee dr duda   POLYPECTOMY     UPPER GASTROINTESTINAL ENDOSCOPY      Prior to Admission medications   Medication Sig Start Date End Date Taking? Authorizing Provider  estradiol  (ESTRACE ) 0.1 MG/GM vaginal cream Place 1 gram vaginally twice weekly or apply small externally 3-4 times weekly 12/25/23  Yes Lo, Donna K, CNM  Multiple Vitamin (MULTIVITAMIN) tablet Take 1 tablet by mouth daily.   Yes [provider]  Multiple Vitamins-Minerals (PRESERVISION AREDS PO) Take by mouth.   Yes [provider]  SYNTHROID  112 MCG tablet TAKE 1 TABLET ONCE DAILY BEFORE BREAKFAST. 07/20/16  Yes Baxley, Ronal PARAS, MD  tretinoin (RETIN-A) 0.05 % cream Apply topically. 06/15/24  Yes [provider]  zolpidem  (AMBIEN ) 5 MG tablet TAKE ONE TABLET BY MOUTH AT BEDTIME AS NEEDED FOR `SLEEP. 06/17/24  Yes Baxley, Ronal PARAS, MD  calcium-vitamin D  (OSCAL WITH D) 500-5 MG-MCG tablet Take 1 tablet by mouth.    [provider]    Current Outpatient Medications  Medication Sig  Dispense Refill   estradiol  (ESTRACE ) 0.1 MG/GM vaginal cream Place 1 gram vaginally twice weekly or apply small externally 3-4 times weekly 42.5 g 1   Multiple Vitamin (MULTIVITAMIN) tablet Take 1 tablet by mouth daily.     Multiple Vitamins-Minerals (PRESERVISION AREDS PO) Take by mouth.     SYNTHROID  112 MCG tablet TAKE 1 TABLET ONCE DAILY BEFORE BREAKFAST. 30 tablet 0   tretinoin (RETIN-A) 0.05 % cream Apply topically.     zolpidem  (AMBIEN ) 5 MG tablet TAKE ONE TABLET BY MOUTH AT BEDTIME AS NEEDED FOR `SLEEP. 90 tablet 1   calcium-vitamin D  (OSCAL WITH D) 500-5 MG-MCG tablet Take 1 tablet by mouth.     Current Facility-Administered Medications  Medication Dose Route Frequency Provider Last Rate Last Admin   0.9 %  sodium chloride  infusion  500 mL Intravenous Once Stacia Glendia BRAVO, MD        Allergies as of 07/15/2024   (No Known Allergies)    Family History  Problem Relation Age of Onset   Stroke Mother    Colon polyps Mother    Diabetes Father    Mental illness Father    Colon polyps Brother    Colon cancer Neg Hx    Esophageal cancer Neg Hx    Rectal cancer Neg Hx    Stomach cancer Neg Hx  Social History   Socioeconomic History   Marital status: Married    Spouse name: Not on file   Number of children: 2   Years of education: Not on file   Highest education level: Not on file  Occupational History   Occupation: retired  Tobacco Use   Smoking status: Former    Current packs/day: 0.00    Types: Cigarettes    Quit date: 04/06/1972    Years since quitting: 52.3   Smokeless tobacco: Never  Vaping Use   Vaping status: Never Used  Substance and Sexual Activity   Alcohol use: Yes    Comment: 1 light beer every day   Drug use: No   Sexual activity: Not on file  Other Topics Concern   Not on file  Social History Narrative   Married - husband is a former Programmer, multimedia of the [employer] and [employer] and teaches at [employer]. 2 adult daughters. 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 Former smoker, for a couple of years in her late 56s, and social alcohol consumption. Exercises regularly by walking.    2023 - expecting a grandchild and will be traveling to [place] with her husband for that event.   2025 - Going to [place] in August to watch her grandchild. Her daughter, Chiquita got married in 08/2023, and now she lives in [place].    Social Drivers of Corporate investment banker Strain: Not on file  Food Insecurity: No Food Insecurity (02/15/2024)   Hunger Vital Sign    Worried About Running Out of Food in the Last Year: Never true    Ran Out of Food in the Last Year: Never true  Transportation Needs: No Transportation Needs (02/15/2024)   PRAPARE - Administrator, Civil Service (Medical): No    Lack of Transportation (Non-Medical): No  Physical Activity: Sufficiently Active (02/15/2024)   Exercise Vital Sign    Days of Exercise per Week: 7 days    Minutes of Exercise per Session: 60 min  Stress: Not on file  Social Connections: Unknown (02/15/2024)   Social Connection and Isolation Panel    Frequency of Communication with Friends and Family: Not on file    Frequency of Social Gatherings with Friends and Family: Not on file    Attends Religious Services: Not on file    Active Member of Clubs or Organizations: Not on file    Attends Banker Meetings: Not on file    Marital Status: Married  Intimate Partner Violence: Not At Risk (02/15/2024)   Humiliation, Afraid, Rape, and Kick questionnaire    Fear of Current or Ex-Partner: No    Emotionally Abused: No    Physically Abused: No    Sexually Abused: No    Review of Systems:  All other review of systems negative except as mentioned in the HPI.  Physical Exam: Vital signs BP 123/71   Pulse 67   Temp 97.9 F (36.6 C)   Ht 5' 3.25 (1.607 m)   Wt 116 lb (52.6 kg)   SpO2 97%   BMI  20.39 kg/m   General:   Alert,  Well-developed, well-nourished, pleasant and cooperative in NAD Airway:  Mallampati 3 Lungs:  Clear throughout to auscultation.   Heart:  Regular rate and rhythm; no murmurs, clicks, rubs,  or gallops. Abdomen:  Soft, nontender and nondistended. Normal bowel sounds.   Neuro/Psych:  Normal  mood and affect. A and O x 3   Delisa Finck E. Stacia, MD [facility]

## 2024-07-15 NOTE — Progress Notes (Signed)
 Called to room to assist during endoscopic procedure.  Patient ID and intended procedure confirmed with present staff. Received instructions for my participation in the procedure from the performing physician.

## 2024-07-15 NOTE — Progress Notes (Signed)
 Pt's states no medical or surgical changes since previsit or office visit.

## 2024-07-15 NOTE — Progress Notes (Signed)
 Report to PACU, RN, vss, BBS= Clear.

## 2024-07-15 NOTE — Op Note (Signed)
 [facility] Patient Name: Maria Perkins Procedure Date: 07/15/2024 1:25 PM MRN: [id number] Endoscopist: Glendia E. Stacia , MD, [id number] Age: 73 Referring MD:  Date of Birth: 09/11/1951 Gender: Female Account #: [id number] Procedure:                Colonoscopy Indications:              Surveillance: Personal history of adenomatous                            polyps on last colonoscopy > 5 years ago (2011;                            single tubular adenoma, 2016: hyperplastic polyp) Medicines:                Monitored Anesthesia Care Procedure:                Pre-Anesthesia Assessment:                           - Prior to the procedure, a History and Physical                            was performed, and patient medications and                            allergies were reviewed. The patient's tolerance of                            previous anesthesia was also reviewed. The risks                            and benefits of the procedure and the sedation                            options and risks were discussed with the patient.                            All questions were answered, and informed consent                            was obtained. Prior Anticoagulants: The patient has                            taken no anticoagulant or antiplatelet agents. ASA                            Grade Assessment: II - A patient with mild systemic                            disease. After reviewing the risks and benefits,                            the patient was deemed in satisfactory condition to  undergo the procedure.                           After obtaining informed consent, the colonoscope                            was passed under direct vision. Throughout the                            procedure, the patient's blood pressure, pulse, and                            oxygen saturations were monitored continuously. The                            CF  HQ190L #[id number] was introduced through the anus                            and advanced to the the cecum, identified by                            appendiceal orifice and ileocecal valve. The                            PCF-HQ190L Colonoscope [id number] was introduced                            through the and advanced to the. The colonoscopy                            was performed without difficulty. The patient                            tolerated the procedure well. The quality of the                            bowel preparation was good. The ileocecal valve,                            appendiceal orifice, and rectum were photographed.                            The bowel preparation used was SUPREP via split                            dose instruction. Scope In: 1:36:24 PM Scope Out: 1:53:07 PM Scope Withdrawal Time: 0 hours 9 minutes 36 seconds  Total Procedure Duration: 0 hours 16 minutes 43 seconds  Findings:                 The perianal and digital rectal examinations were                            normal. Pertinent negatives include normal  sphincter tone and no palpable rectal lesions.                           A 3 mm polyp was found in the transverse colon. The                            polyp was sessile. The polyp was removed with a                            cold snare. Resection and retrieval were complete.                            Estimated blood loss was minimal.                           The exam was otherwise normal throughout the                            examined colon.                           The retroflexed view of the distal rectum and anal                            verge was normal and showed no anal or rectal                            abnormalities. Complications:            No immediate complications. Estimated Blood Loss:     Estimated blood loss was minimal. Impression:               - One 3 mm polyp in the transverse colon,  removed                            with a cold snare. Resected and retrieved.                           - The distal rectum and anal verge are normal on                            retroflexion view. Recommendation:           - Patient has a contact number available for                            emergencies. The signs and symptoms of potential                            delayed complications were discussed with the                            patient. Return to normal activities tomorrow.  Written discharge instructions were provided to the                            patient.                           - Resume previous diet.                           - Continue present medications.                           - Await pathology results.                           - Given patient's age and lack of high risk polyps,                            I would recommend against further colon cancer                            screening. Hatley Henegar E. Stacia, MD 07/15/2024 1:58:57 PM This report has been signed electronically.

## 2024-07-15 NOTE — Patient Instructions (Signed)
 Resume previous diet. Continue present medications.  Awaiting pathology results. Handout provided on polyps.  YOU HAD AN ENDOSCOPIC PROCEDURE TODAY AT THE [facility]:   Refer to the procedure report that was given to you for any specific questions about what was found during the examination.  If the procedure report does not answer your questions, please call your gastroenterologist to clarify.  If you requested that your care partner not be given the details of your procedure findings, then the procedure report has been included in a sealed envelope for you to review at your convenience later.  YOU SHOULD EXPECT: Some feelings of bloating in the abdomen. Passage of more gas than usual.  Walking can help get rid of the air that was put into your GI tract during the procedure and reduce the bloating. If you had a lower endoscopy (such as a colonoscopy or flexible sigmoidoscopy) you may notice spotting of blood in your stool or on the toilet paper. If you underwent a bowel prep for your procedure, you may not have a normal bowel movement for a few days.  Please Note:  You might notice some irritation and congestion in your nose or some drainage.  This is from the oxygen used during your procedure.  There is no need for concern and it should clear up in a day or so.  SYMPTOMS TO REPORT IMMEDIATELY:  Following lower endoscopy (colonoscopy or flexible sigmoidoscopy):  Excessive amounts of blood in the stool  Significant tenderness or worsening of abdominal pains  Swelling of the abdomen that is new, acute  Fever of 100F or higher  For urgent or emergent issues, a gastroenterologist can be reached at any hour by calling [phone]. Do not use MyChart messaging for urgent concerns.    DIET:  We do recommend a small meal at first, but then you may proceed to your regular diet.  Drink plenty of fluids but you should avoid alcoholic beverages for 24 hours.  ACTIVITY:  You should  plan to take it easy for the rest of today and you should NOT DRIVE or use heavy machinery until tomorrow (because of the sedation medicines used during the test).    FOLLOW UP: Our staff will call the number listed on your records the next business day following your procedure.  We will call around 7:15- 8:00 am to check on you and address any questions or concerns that you may have regarding the information given to you following your procedure. If we do not reach you, we will leave a message.     If any biopsies were taken you will be contacted by phone or by letter within the next 1-3 weeks.  Please call us  at [phone] if you have not heard about the biopsies in 3 weeks.    SIGNATURES/CONFIDENTIALITY: You and/or your care partner have signed paperwork which will be entered into your electronic medical record.  These signatures attest to the fact that that the information above on your After Visit Summary has been reviewed and is understood.  Full responsibility of the confidentiality of this discharge information lies with you and/or your care-partner.

## 2024-07-16 ENCOUNTER — Telehealth: Payer: Self-pay | Admitting: *Deleted

## 2024-07-16 NOTE — Telephone Encounter (Signed)
  Follow up Call-     07/15/2024   12:36 PM  Call back number  Post procedure Call Back phone  # [phone]  Permission to leave phone message Yes     Patient questions:  Do you have a fever, pain , or abdominal swelling? No. Pain Score  0 *  Have you tolerated food without any problems? Yes.    Have you been able to return to your normal activities? Yes.    Do you have any questions about your discharge instructions: Diet   No. Medications  No. Follow up visit  No.  Do you have questions or concerns about your Care? No.  Actions: * If pain score is 4 or above: No action needed, pain <4.

## 2024-07-17 ENCOUNTER — Encounter (INDEPENDENT_AMBULATORY_CARE_PROVIDER_SITE_OTHER): Payer: Medicare Other | Admitting: Ophthalmology

## 2024-07-17 DIAGNOSIS — H35372 Puckering of macula, left eye: Secondary | ICD-10-CM | POA: Diagnosis not present

## 2024-07-17 DIAGNOSIS — H353132 Nonexudative age-related macular degeneration, bilateral, intermediate dry stage: Secondary | ICD-10-CM

## 2024-07-17 DIAGNOSIS — D3131 Benign neoplasm of right choroid: Secondary | ICD-10-CM | POA: Diagnosis not present

## 2024-07-17 DIAGNOSIS — H43813 Vitreous degeneration, bilateral: Secondary | ICD-10-CM

## 2024-07-17 DIAGNOSIS — H2513 Age-related nuclear cataract, bilateral: Secondary | ICD-10-CM

## 2024-07-18 ENCOUNTER — Ambulatory Visit: Payer: Self-pay | Admitting: Gastroenterology

## 2024-07-18 DIAGNOSIS — D0361 Melanoma in situ of right upper limb, including shoulder: Secondary | ICD-10-CM | POA: Diagnosis not present

## 2024-07-18 LAB — SURGICAL PATHOLOGY

## 2024-07-18 NOTE — Progress Notes (Signed)
 Maria Perkins,  The polyp which I removed during your recent procedure was proven to be completely benign but is considered a pre-cancerous polyp that MAY have grown into cancer if it had not been removed.  Studies shows that at least 20% of women over age 74 and 30% of men over age 55 have pre-cancerous polyps.  Based on current nationally recognized surveillance guidelines, it would be recommended that you have a repeat colonoscopy in 7 years.   However,  because colon cancer screening after age 55 is done on a case-by-case basis, taking into account the patient's risk factors for colon cancer, as well as comorbidities and life expectancy, I would recommend against further colon cancer screening.

## 2024-08-05 DIAGNOSIS — Z23 Encounter for immunization: Secondary | ICD-10-CM | POA: Diagnosis not present

## 2024-08-07 DIAGNOSIS — H5203 Hypermetropia, bilateral: Secondary | ICD-10-CM | POA: Diagnosis not present

## 2024-08-27 ENCOUNTER — Other Ambulatory Visit (HOSPITAL_BASED_OUTPATIENT_CLINIC_OR_DEPARTMENT_OTHER): Payer: Self-pay

## 2024-08-27 DIAGNOSIS — N952 Postmenopausal atrophic vaginitis: Secondary | ICD-10-CM

## 2024-08-27 MED ORDER — ESTRADIOL 0.1 MG/GM VA CREA: TOPICAL_CREAM | VAGINAL | 0 refills | Status: AC

## 2024-09-02 DIAGNOSIS — M810 Age-related osteoporosis without current pathological fracture: Secondary | ICD-10-CM | POA: Diagnosis not present

## 2024-09-02 DIAGNOSIS — E039 Hypothyroidism, unspecified: Secondary | ICD-10-CM | POA: Diagnosis not present

## 2024-09-17 ENCOUNTER — Other Ambulatory Visit: Payer: Self-pay | Admitting: Internal Medicine

## 2024-09-17 MED ORDER — SULINDAC 200 MG PO TABS: 200.0000 mg | ORAL_TABLET | Freq: Two times a day (BID) | ORAL | 2 refills | Status: AC

## 2024-09-17 NOTE — Telephone Encounter (Signed)
 Copied from CRM [id number]. Topic: Clinical - Medication Question >> Sep 17, 2024  3:08 PM Tiffini S wrote: Reason for CRM: Patient called stating that the medication was denied for back pain- the prescription has expired and she needs the medication renew   Sulindac  200mg  tablet- please send to:  [facility] [place], [place] - [address] [place] [address] Phone: [phone] Fax: [phone]  Please call the patient at [phone]

## 2025-02-14 ENCOUNTER — Other Ambulatory Visit: Payer: Self-pay

## 2025-02-17 ENCOUNTER — Ambulatory Visit: Payer: Self-pay | Admitting: Internal Medicine

## 2025-03-10 ENCOUNTER — Other Ambulatory Visit

## 2025-03-11 ENCOUNTER — Ambulatory Visit: Admitting: Internal Medicine

## 2025-07-17 ENCOUNTER — Encounter (INDEPENDENT_AMBULATORY_CARE_PROVIDER_SITE_OTHER): Admitting: Ophthalmology
